# Patient Record
Sex: Female | Born: 1990
Health system: Southern US, Community
[De-identification: ages and names within clinical notes are randomized; demographics above are authoritative.]

## PROBLEM LIST (undated history)

## (undated) DIAGNOSIS — E041 Nontoxic single thyroid nodule: Secondary | ICD-10-CM

## (undated) DIAGNOSIS — J45909 Unspecified asthma, uncomplicated: Secondary | ICD-10-CM

## (undated) HISTORY — DX: Nontoxic single thyroid nodule: E04.1

---

## 2016-06-11 DIAGNOSIS — Z7189 Other specified counseling: Secondary | ICD-10-CM | POA: Diagnosis not present

## 2016-06-11 DIAGNOSIS — Z23 Encounter for immunization: Secondary | ICD-10-CM | POA: Diagnosis not present

## 2016-07-08 DIAGNOSIS — R5383 Other fatigue: Secondary | ICD-10-CM | POA: Diagnosis not present

## 2016-07-08 DIAGNOSIS — E041 Nontoxic single thyroid nodule: Secondary | ICD-10-CM | POA: Diagnosis not present

## 2016-07-08 DIAGNOSIS — N912 Amenorrhea, unspecified: Secondary | ICD-10-CM | POA: Diagnosis not present

## 2016-07-10 ENCOUNTER — Other Ambulatory Visit: Payer: Self-pay | Admitting: Family Medicine

## 2016-07-10 DIAGNOSIS — E041 Nontoxic single thyroid nodule: Secondary | ICD-10-CM

## 2016-07-15 ENCOUNTER — Other Ambulatory Visit: Payer: Self-pay

## 2016-07-17 DIAGNOSIS — R21 Rash and other nonspecific skin eruption: Secondary | ICD-10-CM | POA: Diagnosis not present

## 2016-07-19 ENCOUNTER — Ambulatory Visit
Admission: RE | Admit: 2016-07-19 | Discharge: 2016-07-19 | Disposition: A | Discharge status: Home / Self Care | Payer: 59 | Source: Ambulatory Visit | Attending: Family Medicine | Admitting: Family Medicine

## 2016-07-19 DIAGNOSIS — E041 Nontoxic single thyroid nodule: Secondary | ICD-10-CM

## 2016-07-19 DIAGNOSIS — E042 Nontoxic multinodular goiter: Secondary | ICD-10-CM | POA: Diagnosis not present

## 2016-07-20 DIAGNOSIS — J020 Streptococcal pharyngitis: Secondary | ICD-10-CM | POA: Diagnosis not present

## 2016-07-24 ENCOUNTER — Other Ambulatory Visit (HOSPITAL_COMMUNITY)
Admission: RE | Admit: 2016-07-24 | Discharge: 2016-07-24 | Disposition: A | Discharge status: Home / Self Care | Payer: 59 | Source: Ambulatory Visit | Attending: Endocrinology | Admitting: Endocrinology

## 2016-07-24 ENCOUNTER — Encounter: Payer: Self-pay | Admitting: Endocrinology

## 2016-07-24 ENCOUNTER — Ambulatory Visit (INDEPENDENT_AMBULATORY_CARE_PROVIDER_SITE_OTHER): Payer: 59 | Admitting: Endocrinology

## 2016-07-24 DIAGNOSIS — E041 Nontoxic single thyroid nodule: Secondary | ICD-10-CM | POA: Diagnosis not present

## 2016-07-24 NOTE — Progress Notes (Signed)
   Subjective:    Patient ID: Diamond Bell, female    DOB: 1990/08/18, 26 y.o.   MRN: [id number]  HPI Pt is referred by Dr Drema Dallas, for nodular thyroid.  Last week, pt was noted to have a nodule at the right ant neck.  No assoc pain.  she is unaware of ever having had thyroid problems in the past.  she has no h/o XRT or surgery to the neck.  She has slight fatigue. Past Medical History:  Diagnosis Date  . Right thyroid nodule     No past surgical history on file.  Social History   Social History  . Marital status: Single    Spouse name: N/A  . Number of children: N/A  . Years of education: N/A   Occupational History  . Not on file.   Social History Main Topics  . Smoking status: Never Smoker  . Smokeless tobacco: Never Used  . Alcohol use Yes  . Drug use: Unknown  . Sexual activity: Not on file   Other Topics Concern  . Not on file   Social History Narrative  . No narrative on file    No current outpatient prescriptions on file prior to visit.   No current facility-administered medications on file prior to visit.     No Known Allergies  Family History  Problem Relation Age of Onset  . Thyroid disease Mother     BP 120/80   Pulse 96   Ht 5' 7.5" (1.715 m)   Wt 132 lb (59.9 kg)   LMP 04/05/2016   SpO2 97%   BMI 20.37 kg/m   Review of Systems Denies weight change, hoarseness, diplopia, chest pain, sob, dysphagia, diarrhea, itching, flushing, easy bruising, headache, numbness, and rhinorrhea.  She has no menses x 3 mos.  She has slight cough.  She has mild depression and cold intolerance.    Objective:   Physical Exam VS: see vs page GEN: no distress HEAD: head: no deformity eyes: no periorbital swelling, no proptosis external nose and ears are normal mouth: no lesion seen NECK: th right thyroid nodule is easily palpable.  CHEST WALL: no deformity LUNGS: clear to auscultation CV: reg rate and rhythm, no murmur ABD: abdomen is soft, nontender.  no  hepatosplenomegaly.  not distended.  no hernia MUSCULOSKELETAL: muscle bulk and strength are grossly normal.  no obvious joint swelling.  gait is normal and steady EXTEMITIES: no deformity.  no edema PULSES: no carotid bruit NEURO:  cn 2-12 grossly intact.   readily moves all 4's.  sensation is intact to touch on all 4's SKIN:  Normal texture and temperature.  No rash or suspicious lesion is visible.   NODES:  Few palpable at the neck (right jaw angle) PSYCH: alert, well-oriented.  Does not appear anxious nor depressed.   [place]: 2.9 cm solid inferior right thyroid nodule.  outside test results are reviewed: TSH=1.55  thyroid needle bx: consent obtained, signed form on chart The area is first sprayed with cooling agent local: xylocaine 2%, with epinephrine prep: alcohol pad 4 bxs are done with 30 and 27g needles. no complications    Assessment & Plan:  Thyroid nodule: uncertain etiology.   Lymphadenopathy, new: pt advised to call us if it persists x 1 month.   Patient is advised the following: Patient Instructions  We'll let you know about the biopsy results. If as expected, no cancer is found, please come back for a follow-up appointment in 6 months.

## 2016-07-24 NOTE — Patient Instructions (Signed)
We'll let you know about the biopsy results.  If as expected, no cancer is found, please come back for a follow-up appointment in 6 months  

## 2016-07-25 ENCOUNTER — Telehealth: Payer: Self-pay | Admitting: Endocrinology

## 2016-07-25 DIAGNOSIS — J029 Acute pharyngitis, unspecified: Secondary | ICD-10-CM | POA: Diagnosis not present

## 2016-07-25 NOTE — Telephone Encounter (Signed)
please call patient: Please also advise pt: if the lymph node at your right neck persists x 1 month, please call us.  It is very unlikely thyroid-related, but it should be checked if it persists.

## 2016-07-26 DIAGNOSIS — N912 Amenorrhea, unspecified: Secondary | ICD-10-CM | POA: Diagnosis not present

## 2016-07-26 DIAGNOSIS — J020 Streptococcal pharyngitis: Secondary | ICD-10-CM | POA: Diagnosis not present

## 2016-07-26 NOTE — Telephone Encounter (Signed)
Patient notified of message and voiced understanding and had no further questions at this time.

## 2016-07-31 DIAGNOSIS — J029 Acute pharyngitis, unspecified: Secondary | ICD-10-CM | POA: Diagnosis not present

## 2017-01-24 ENCOUNTER — Ambulatory Visit: Payer: 59 | Admitting: Endocrinology

## 2017-06-27 ENCOUNTER — Ambulatory Visit (INDEPENDENT_AMBULATORY_CARE_PROVIDER_SITE_OTHER): Payer: 59 | Admitting: Endocrinology

## 2017-06-27 ENCOUNTER — Encounter: Payer: Self-pay | Admitting: Endocrinology

## 2017-06-27 VITALS — BP 104/76 | HR 84 | Wt 130.2 lb

## 2017-06-27 DIAGNOSIS — E041 Nontoxic single thyroid nodule: Secondary | ICD-10-CM | POA: Diagnosis not present

## 2017-06-27 NOTE — Progress Notes (Signed)
   Subjective:    Patient ID: Diamond Bell, female    DOB: 01-19-1991, 27 y.o.   MRN: [id number]  HPI Pt returns for f/u of thyroid nodule (dx'ed 2018; [place] then showed 2.9 cm solid inferior right thyroid nodule; bx showed BENIGN FOLLICULAR NODULE (BETHESDA CATEGORY II)).   She does notice the nodule.   Past Medical History:  Diagnosis Date  . Right thyroid nodule     History reviewed. No pertinent surgical history.  Social History   Socioeconomic History  . Marital status: Single    Spouse name: Not on file  . Number of children: Not on file  . Years of education: Not on file  . Highest education level: Not on file  Social Needs  . Financial resource strain: Not on file  . Food insecurity - worry: Not on file  . Food insecurity - inability: Not on file  . Transportation needs - medical: Not on file  . Transportation needs - non-medical: Not on file  Occupational History  . Not on file  Tobacco Use  . Smoking status: Never Smoker  . Smokeless tobacco: Never Used  Substance and Sexual Activity  . Alcohol use: Yes  . Drug use: Not on file  . Sexual activity: Not on file  Other Topics Concern  . Not on file  Social History Narrative  . Not on file    No current outpatient medications on file prior to visit.   No current facility-administered medications on file prior to visit.     No Known Allergies  Family History  Problem Relation Age of Onset  . Thyroid disease Mother     BP 104/76 (BP Location: Left Arm, Patient Position: Sitting, Cuff Size: Normal)   Pulse 84   Wt 130 lb 3.2 oz (59.1 kg)   SpO2 98%   BMI 20.09 kg/m   Review of Systems Denies neck pain and hoarseness.      Objective:   Physical Exam VS: see vs page GEN: no distress NECK: the right thyroid nodule is again easily palpable.       Assessment & Plan:  Thyroid nodule: we discussed blood test and [place].  She declines for now  Patient Instructions  I'm glad you are feeling  well. Please come back for a follow-up appointment in 1 year.

## 2017-06-27 NOTE — Patient Instructions (Addendum)
I'm glad you are feeling well. Please come back for a follow-up appointment in 1 year.

## 2017-07-09 DIAGNOSIS — E049 Nontoxic goiter, unspecified: Secondary | ICD-10-CM | POA: Diagnosis not present

## 2017-07-09 DIAGNOSIS — M542 Cervicalgia: Secondary | ICD-10-CM | POA: Diagnosis not present

## 2017-07-09 DIAGNOSIS — S0990XA Unspecified injury of head, initial encounter: Secondary | ICD-10-CM | POA: Diagnosis not present

## 2018-01-01 DIAGNOSIS — R110 Nausea: Secondary | ICD-10-CM | POA: Diagnosis not present

## 2018-06-29 ENCOUNTER — Ambulatory Visit: Payer: 59 | Admitting: Endocrinology

## 2019-02-23 DIAGNOSIS — D367 Benign neoplasm of other specified sites: Secondary | ICD-10-CM | POA: Insufficient documentation

## 2019-02-23 DIAGNOSIS — R1031 Right lower quadrant pain: Secondary | ICD-10-CM | POA: Diagnosis present

## 2019-02-23 DIAGNOSIS — Z79899 Other long term (current) drug therapy: Secondary | ICD-10-CM | POA: Diagnosis not present

## 2019-02-23 LAB — URINALYSIS, ROUTINE W REFLEX MICROSCOPIC
Bilirubin Urine: NEGATIVE
Glucose, UA: NEGATIVE mg/dL
Hgb urine dipstick: NEGATIVE
Ketones, ur: 20 mg/dL — AB
Nitrite: NEGATIVE
Protein, ur: NEGATIVE mg/dL
Specific Gravity, Urine: 1.015 (ref 1.005–1.030)
pH: 6 (ref 5.0–8.0)

## 2019-02-23 LAB — COMPREHENSIVE METABOLIC PANEL
ALT: 21 U/L (ref 0–44)
AST: 21 U/L (ref 15–41)
Albumin: 4.7 g/dL (ref 3.5–5.0)
Alkaline Phosphatase: 48 U/L (ref 38–126)
Anion gap: 13 (ref 5–15)
BUN: 9 mg/dL (ref 6–20)
CO2: 21 mmol/L — ABNORMAL LOW (ref 22–32)
Calcium: 8.7 mg/dL — ABNORMAL LOW (ref 8.9–10.3)
Chloride: 103 mmol/L (ref 98–111)
Creatinine, Ser: 0.7 mg/dL (ref 0.44–1.00)
GFR calc Af Amer: >60 mL/min (ref >60)
GFR calc non Af Amer: >60 mL/min (ref >60)
Glucose, Bld: 144 mg/dL — ABNORMAL HIGH (ref 70–99)
Potassium: 3 mmol/L — ABNORMAL LOW (ref 3.5–5.1)
Sodium: 137 mmol/L (ref 135–145)
Total Bilirubin: 1 mg/dL (ref 0.3–1.2)
Total Protein: 7 g/dL (ref 6.5–8.1)

## 2019-02-23 LAB — CBC
HCT: 46.1 % — ABNORMAL HIGH (ref 36.0–46.0)
Hemoglobin: 15.4 g/dL — ABNORMAL HIGH (ref 12.0–15.0)
MCH: 31 pg (ref 26.0–34.0)
MCHC: 33.4 g/dL (ref 30.0–36.0)
MCV: 92.8 fL (ref 80.0–100.0)
Platelets: 263 10*3/uL (ref 150–400)
RBC: 4.97 MIL/uL (ref 3.87–5.11)
RDW: 11.4 % — ABNORMAL LOW (ref 11.5–15.5)
WBC: 10.5 10*3/uL (ref 4.0–10.5)
nRBC: 0 % (ref 0.0–0.2)

## 2019-02-23 LAB — LIPASE, BLOOD: Lipase: 31 U/L (ref 11–51)

## 2019-02-23 LAB — I-STAT BETA HCG BLOOD, ED (MC, WL, AP ONLY): I-stat hCG, quantitative: <5 m[IU]/mL (ref <5)

## 2019-02-23 MED ORDER — SODIUM CHLORIDE 0.9% FLUSH
3.0000 mL | Freq: Once | INTRAVENOUS | Status: AC
  Administered 2019-02-24: 02:00:00 3 mL via INTRAVENOUS

## 2019-02-23 NOTE — ED Triage Notes (Signed)
LRQ pain with diarrhea and nausea, tender to touch

## 2019-02-24 ENCOUNTER — Emergency Department (HOSPITAL_COMMUNITY): Payer: 59

## 2019-02-24 ENCOUNTER — Encounter (HOSPITAL_COMMUNITY): Payer: Self-pay

## 2019-02-24 ENCOUNTER — Telehealth: Payer: Self-pay | Admitting: *Deleted

## 2019-02-24 ENCOUNTER — Emergency Department (HOSPITAL_COMMUNITY)
Admission: EM | Admit: 2019-02-24 | Discharge: 2019-02-24 | Disposition: A | Discharge status: Home / Self Care | Payer: 59 | Attending: Emergency Medicine | Admitting: Emergency Medicine

## 2019-02-24 DIAGNOSIS — R1900 Intra-abdominal and pelvic swelling, mass and lump, unspecified site: Secondary | ICD-10-CM

## 2019-02-24 DIAGNOSIS — D369 Benign neoplasm, unspecified site: Secondary | ICD-10-CM

## 2019-02-24 MED ORDER — ONDANSETRON HCL 4 MG/2ML IJ SOLN
4.0000 mg | Freq: Once | INTRAMUSCULAR | Status: AC
  Administered 2019-02-24: 4 mg via INTRAVENOUS
  Filled 2019-02-24: qty 2

## 2019-02-24 MED ORDER — OXYCODONE-ACETAMINOPHEN 5-325 MG PO TABS: 1.0000 | ORAL_TABLET | Freq: Four times a day (QID) | ORAL | 0 refills | Status: DC | PRN

## 2019-02-24 MED ORDER — OXYCODONE-ACETAMINOPHEN 5-325 MG PO TABS
1.0000 | ORAL_TABLET | Freq: Once | ORAL | Status: AC
  Administered 2019-02-24: 08:00:00 1 via ORAL
  Filled 2019-02-24: qty 1

## 2019-02-24 MED ORDER — MORPHINE SULFATE (PF) 4 MG/ML IV SOLN
4.0000 mg | Freq: Once | INTRAVENOUS | Status: AC
  Administered 2019-02-24: 02:00:00 4 mg via INTRAVENOUS
  Filled 2019-02-24: qty 1

## 2019-02-24 MED ORDER — SODIUM CHLORIDE 0.9 % IV BOLUS
1000.0000 mL | Freq: Once | INTRAVENOUS | Status: AC
  Administered 2019-02-24: 02:00:00 1000 mL via INTRAVENOUS

## 2019-02-24 MED ORDER — SODIUM CHLORIDE (PF) 0.9 % IJ SOLN
INTRAMUSCULAR | Status: AC
  Administered 2019-02-24: 07:00:00
  Filled 2019-02-24: qty 50

## 2019-02-24 MED ORDER — IOHEXOL 300 MG/ML  SOLN
100.0000 mL | Freq: Once | INTRAMUSCULAR | Status: AC | PRN
  Administered 2019-02-24: 03:00:00 100 mL via INTRAVENOUS

## 2019-02-24 NOTE — Discharge Instructions (Signed)
You were seen today for pelvic pain.  YOu have a dermoid cyst.  Follow-up with [facility].

## 2019-02-24 NOTE — ED Notes (Signed)
Patient transported to CT 

## 2019-02-24 NOTE — Telephone Encounter (Signed)
I received a call from [place]. She explained she was seen in ER and they told her to call our number to get surgery scheduled. I informed her she would need to be seen in the office first by a provider and then surgery scheduled. I did inform her I can see the ER doctor spoke with Dr.Stinson but did not indicate if it was our office or one of our sister offices. I informed her I will message the doctor and we will get back to her. She states it was supposed to be urgent due to the pain she is having.  I checked with our front desk and she was scheduled for 03/09/19 . I called [place] to inform her and left a message stating she has an appointment 03/09/19 and I will check with provider to see if he thinks it should be sooner.  Chris Cripps,RN

## 2019-02-24 NOTE — ED Provider Notes (Signed)
[facility] DEPT Provider Note   CSN: [id number] Arrival date & time: 02/23/19  1853     History   Chief Complaint Chief Complaint  Patient presents with   Abdominal Pain    HPI Diamond Bell is a 28 y.o. female.     HPI  This is a 28 year old female who presents with right lower quadrant pain.  Patient reports onset of symptoms around 4 PM yesterday.  She reports gradually worsening right lower quadrant pain that is nonradiating.  She has had nausea without vomiting.  No fevers.  No urinary symptoms.  She denies any back pain or history of kidney stones.  She does state that she had some loose stools yesterday.  No fevers.  She currently rates her pain at 7 out of 10.  She has not taken anything for pain.  Denies vaginal discharge or concerns for STDs.  Past Medical History:  Diagnosis Date   Right thyroid nodule     Patient Active Problem List   Diagnosis Date Noted   Right thyroid nodule     No past surgical history on file.   OB History   No obstetric history on file.      Home Medications    Prior to Admission medications   Medication Sig Start Date End Date Taking? Authorizing Provider  Ascorbic Acid (VITAMIN C) 1000 MG tablet Take 1,000 mg by mouth daily.   Yes [provider]  cholecalciferol (VITAMIN D3) 25 MCG (1000 UT) tablet Take 1,000 Units by mouth daily.   Yes [provider]  EVENING PRIMROSE OIL PO Take 1 tablet by mouth daily.   Yes [provider]  ibuprofen (ADVIL) 200 MG tablet Take 400 mg by mouth every 6 (six) hours as needed for moderate pain.   Yes [provider]  oxyCODONE-acetaminophen (PERCOCET/ROXICET) 5-325 MG tablet Take 1 tablet by mouth every 6 (six) hours as needed for severe pain. 02/24/19   Devun Anna, Barbette Hair, MD    Family History Family History  Problem Relation Age of Onset   Thyroid disease Mother     Social History Social History   Tobacco Use     Smoking status: Never Smoker   Smokeless tobacco: Never Used  Substance Use Topics   Alcohol use: Yes   Drug use: Not on file     Allergies   Patient has no known allergies.   Review of Systems Review of Systems  Constitutional: Negative for fever.  Respiratory: Negative for shortness of breath.   Cardiovascular: Negative for chest pain.  Gastrointestinal: Positive for abdominal pain and nausea. Negative for vomiting.  Genitourinary: Negative for dysuria.  Musculoskeletal: Negative for back pain.  All other systems reviewed and are negative.    Physical Exam Updated Vital Signs BP 114/70    Pulse 89    Temp 98.9 F (37.2 C) (Oral)    Resp 16    Ht 1.702 m (5\' 7" )    Wt 59 kg    SpO2 95%    BMI 20.36 kg/m   Physical Exam Vitals signs and nursing note reviewed.  Constitutional:      Appearance: She is well-developed.  HENT:     Head: Normocephalic and atraumatic.  Eyes:     Pupils: Pupils are equal, round, and reactive to light.  Neck:     Musculoskeletal: Neck supple.  Cardiovascular:     Rate and Rhythm: Normal rate and regular rhythm.     Heart sounds: Normal  heart sounds.  Pulmonary:     Effort: Pulmonary effort is normal. No respiratory distress.     Breath sounds: No wheezing.  Abdominal:     General: Bowel sounds are normal.     Palpations: Abdomen is soft.     Tenderness: There is abdominal tenderness in the right lower quadrant. There is no guarding or rebound. Positive signs include Rovsing's sign.  Skin:    General: Skin is warm and dry.  Neurological:     Mental Status: She is alert and oriented to person, place, and time.  Psychiatric:        Mood and Affect: Mood normal.      ED Treatments / Results  Labs (all labs ordered are listed, but only abnormal results are displayed) Labs Reviewed  COMPREHENSIVE METABOLIC PANEL - Abnormal; Notable for the following components:      Result Value   Potassium 3.0 (*)    CO2 21 (*)    Glucose,  Bld 144 (*)    Calcium 8.7 (*)    All other components within normal limits  CBC - Abnormal; Notable for the following components:   Hemoglobin 15.4 (*)    HCT 46.1 (*)    RDW 11.4 (*)    All other components within normal limits  URINALYSIS, ROUTINE W REFLEX MICROSCOPIC - Abnormal; Notable for the following components:   APPearance HAZY (*)    Ketones, ur 20 (*)    Leukocytes,Ua SMALL (*)    Bacteria, UA RARE (*)    All other components within normal limits  LIPASE, BLOOD  I-STAT BETA HCG BLOOD, ED ([facility], [facility], [facility] ONLY)    EKG None  Radiology US Pelvis Transvaginal Non-ob (tv Only)  Addendum Date: 02/24/2019   ADDENDUM REPORT: 02/24/2019 05:08 ADDENDUM: This addendum is created after discussion with the technologist to clarify pelvic mass finding. The heterogeneous 9.4 x 7.2 x 8.1 cm heterogeneous mass consistent with dermoid arises from the right ovary. Electronically Signed   By: Keith Rake M.D.   On: 02/24/2019 05:08   Result Date: 02/24/2019 CLINICAL DATA:  Initial evaluation for pelvic mass. EXAM: TRANSABDOMINAL AND TRANSVAGINAL ULTRASOUND OF PELVIS DOPPLER ULTRASOUND OF OVARIES TECHNIQUE: Both transabdominal and transvaginal ultrasound examinations of the pelvis were performed. Transabdominal technique was performed for global imaging of the pelvis including uterus, ovaries, adnexal regions, and pelvic cul-de-sac. It was necessary to proceed with endovaginal exam following the transabdominal exam to visualize the uterus, endometrium, and ovaries. Color and duplex Doppler ultrasound was utilized to evaluate blood flow to the ovaries. COMPARISON:  Prior CT from earlier the same day. FINDINGS: Uterus Measurements: 8.7 x 2.8 x 4.5 cm = volume: 56.6 mL. No fibroids or other mass visualized. Endometrium Thickness: 9.3 mm.  No focal abnormality visualized. Right ovary Measurements: 8.8 x 4.5 x 7.9 cm = volume: 164.6 mL. Normal appearance/no adnexal mass. Left ovary Measurements: 5.4 x  2.7 x 3.6 cm = volume: 27.5 mL. Normal appearance/no adnexal mass. Pulsed Doppler evaluation of both ovaries demonstrates normal low-resistance arterial and venous waveforms. Other findings Previously identified large pelvic mass again seen along the midline of the pelvis, measuring 9.4 x 7.2 x 8.1 cm. Central hyperechoic echotexture consistent with fat. Again, this lesion is most consistent with a dermoid. Difficult to delineate which ovary this arises from given size and midline location. Associated small volume free fluid noted within the pelvis. IMPRESSION: 1. 9.4 x 7.2 x 8.1 cm heterogeneous mass positioned at the mid pelvis, consistent with  previously identified dermoid. Given size and midline position, it is difficult to delineate which ovary this arises from. Gynecologic consultation recommended. Additionally, further evaluation with dedicated pelvic MRI may be helpful for further characterization as warranted. 2. No evidence for ovarian torsion or other acute abnormality. Electronically Signed: By: Jeannine Boga M.D. On: 02/24/2019 04:45   US Pelvis Complete  Addendum Date: 02/24/2019   ADDENDUM REPORT: 02/24/2019 05:08 ADDENDUM: This addendum is created after discussion with the technologist to clarify pelvic mass finding. The heterogeneous 9.4 x 7.2 x 8.1 cm heterogeneous mass consistent with dermoid arises from the right ovary. Electronically Signed   By: Keith Rake M.D.   On: 02/24/2019 05:08   Result Date: 02/24/2019 CLINICAL DATA:  Initial evaluation for pelvic mass. EXAM: TRANSABDOMINAL AND TRANSVAGINAL ULTRASOUND OF PELVIS DOPPLER ULTRASOUND OF OVARIES TECHNIQUE: Both transabdominal and transvaginal ultrasound examinations of the pelvis were performed. Transabdominal technique was performed for global imaging of the pelvis including uterus, ovaries, adnexal regions, and pelvic cul-de-sac. It was necessary to proceed with endovaginal exam following the transabdominal exam to  visualize the uterus, endometrium, and ovaries. Color and duplex Doppler ultrasound was utilized to evaluate blood flow to the ovaries. COMPARISON:  Prior CT from earlier the same day. FINDINGS: Uterus Measurements: 8.7 x 2.8 x 4.5 cm = volume: 56.6 mL. No fibroids or other mass visualized. Endometrium Thickness: 9.3 mm.  No focal abnormality visualized. Right ovary Measurements: 8.8 x 4.5 x 7.9 cm = volume: 164.6 mL. Normal appearance/no adnexal mass. Left ovary Measurements: 5.4 x 2.7 x 3.6 cm = volume: 27.5 mL. Normal appearance/no adnexal mass. Pulsed Doppler evaluation of both ovaries demonstrates normal low-resistance arterial and venous waveforms. Other findings Previously identified large pelvic mass again seen along the midline of the pelvis, measuring 9.4 x 7.2 x 8.1 cm. Central hyperechoic echotexture consistent with fat. Again, this lesion is most consistent with a dermoid. Difficult to delineate which ovary this arises from given size and midline location. Associated small volume free fluid noted within the pelvis. IMPRESSION: 1. 9.4 x 7.2 x 8.1 cm heterogeneous mass positioned at the mid pelvis, consistent with previously identified dermoid. Given size and midline position, it is difficult to delineate which ovary this arises from. Gynecologic consultation recommended. Additionally, further evaluation with dedicated pelvic MRI may be helpful for further characterization as warranted. 2. No evidence for ovarian torsion or other acute abnormality. Electronically Signed: By: Jeannine Boga M.D. On: 02/24/2019 04:45   Ct Abdomen Pelvis W Contrast  Result Date: 02/24/2019 CLINICAL DATA:  Right lower quadrant abdominal pain. EXAM: CT ABDOMEN AND PELVIS WITH CONTRAST TECHNIQUE: Multidetector CT imaging of the abdomen and pelvis was performed using the standard protocol following bolus administration of intravenous contrast. CONTRAST:  180mL OMNIPAQUE IOHEXOL 300 MG/ML  SOLN COMPARISON:  None.  FINDINGS: Lower chest: Lung bases are clear. Hepatobiliary: No focal liver abnormality is seen. No gallstones, gallbladder wall thickening, or biliary dilatation. Pancreas: No ductal dilatation or inflammation. Spleen: Normal in size without focal abnormality. Splenule anteriorly. Adrenals/Urinary Tract: Normal adrenal glands. No hydronephrosis or perinephric edema. Homogeneous renal enhancement. Urinary bladder is physiologically distended without wall thickening. Stomach/Bowel: Normal appendix, for example series 2, image 57. Moderate stool in the right colon, remainder of the colon is decompressed. No bowel obstruction or inflammation. The stomach is unremarkable. Vascular/Lymphatic: Abdominal aorta is normal in caliber. The portal vein is patent. Slight prominence of left ovarian veins. No adenopathy. Reproductive: Large heterogeneous pelvic mass which involves the midline, as  well as both adnexa, slightly more prominent on the left. This measures 11.5 x 9.4 x 8.7 cm. Is heterogeneous enhancement in a fat containing component, findings consistent with dermoid. Is uncertain which ovary this arises from, however does appear to extend more into the left adnexa. Uterus is displaced anteriorly. Probable nabothian cyst. Slight prominent left adnexal vascularity. Other: No free fluid. No free air. Musculoskeletal: There are no acute or suspicious osseous abnormalities. IMPRESSION: 1. Large heterogeneous pelvic mass measuring 11.5 x 9.4 x 8.7 cm with fatty components consistent with dermoid. It is uncertain which ovary this arises from, however does appear to extend more into the left adnexa. While pelvic ultrasound may provide more detailed assessment, given size, gynecologic consultation is recommended. 2. Normal appendix. Electronically Signed   By: Keith Rake M.D.   On: 02/24/2019 02:58   US Pelvic Doppler (torsion R/o Or Mass Arterial Flow)  Addendum Date: 02/24/2019   ADDENDUM REPORT: 02/24/2019 05:08  ADDENDUM: This addendum is created after discussion with the technologist to clarify pelvic mass finding. The heterogeneous 9.4 x 7.2 x 8.1 cm heterogeneous mass consistent with dermoid arises from the right ovary. Electronically Signed   By: Keith Rake M.D.   On: 02/24/2019 05:08   Result Date: 02/24/2019 CLINICAL DATA:  Initial evaluation for pelvic mass. EXAM: TRANSABDOMINAL AND TRANSVAGINAL ULTRASOUND OF PELVIS DOPPLER ULTRASOUND OF OVARIES TECHNIQUE: Both transabdominal and transvaginal ultrasound examinations of the pelvis were performed. Transabdominal technique was performed for global imaging of the pelvis including uterus, ovaries, adnexal regions, and pelvic cul-de-sac. It was necessary to proceed with endovaginal exam following the transabdominal exam to visualize the uterus, endometrium, and ovaries. Color and duplex Doppler ultrasound was utilized to evaluate blood flow to the ovaries. COMPARISON:  Prior CT from earlier the same day. FINDINGS: Uterus Measurements: 8.7 x 2.8 x 4.5 cm = volume: 56.6 mL. No fibroids or other mass visualized. Endometrium Thickness: 9.3 mm.  No focal abnormality visualized. Right ovary Measurements: 8.8 x 4.5 x 7.9 cm = volume: 164.6 mL. Normal appearance/no adnexal mass. Left ovary Measurements: 5.4 x 2.7 x 3.6 cm = volume: 27.5 mL. Normal appearance/no adnexal mass. Pulsed Doppler evaluation of both ovaries demonstrates normal low-resistance arterial and venous waveforms. Other findings Previously identified large pelvic mass again seen along the midline of the pelvis, measuring 9.4 x 7.2 x 8.1 cm. Central hyperechoic echotexture consistent with fat. Again, this lesion is most consistent with a dermoid. Difficult to delineate which ovary this arises from given size and midline location. Associated small volume free fluid noted within the pelvis. IMPRESSION: 1. 9.4 x 7.2 x 8.1 cm heterogeneous mass positioned at the mid pelvis, consistent with previously  identified dermoid. Given size and midline position, it is difficult to delineate which ovary this arises from. Gynecologic consultation recommended. Additionally, further evaluation with dedicated pelvic MRI may be helpful for further characterization as warranted. 2. No evidence for ovarian torsion or other acute abnormality. Electronically Signed: By: Jeannine Boga M.D. On: 02/24/2019 04:45    Procedures Procedures (including critical care time)  Medications Ordered in ED Medications  sodium chloride flush (NS) 0.9 % injection 3 mL (3 mLs Intravenous Given 02/24/19 0222)  sodium chloride 0.9 % bolus 1,000 mL (0 mLs Intravenous Stopped 02/24/19 0414)  morphine 4 MG/ML injection 4 mg (4 mg Intravenous Given 02/24/19 0221)  ondansetron (ZOFRAN) injection 4 mg (4 mg Intravenous Given 02/24/19 0221)  sodium chloride (PF) 0.9 % injection (  Given by Other 02/24/19 0644)  iohexol (OMNIPAQUE) 300 MG/ML solution 100 mL (100 mLs Intravenous Contrast Given 02/24/19 0236)  oxyCODONE-acetaminophen (PERCOCET/ROXICET) 5-325 MG per tablet 1 tablet (1 tablet Oral Given 02/24/19 0740)     Initial Impression / Assessment and Plan / ED Course  I have reviewed the triage vital signs and the nursing notes.  Pertinent labs & imaging results that were available during my care of the patient were reviewed by me and considered in my medical decision making (see chart for details).        This is a 28 year old female who presents with abdominal pain.  She is overall nontoxic-appearing and vital signs are reassuring.  She does have tenderness on exam in the right lower quadrant.  Considerations include appendicitis, ovarian pathology.  She denies any history or concern for STDs.  Patient was given pain and nausea medicine.  Lab work reviewed and largely reassuring; however, given tenderness, will obtain CT.  CT is concerning for likely a dermoid cyst but question the origin.  Pelvic ultrasound was obtained.   Pelvic ultrasound confirms a large likely dermoid cyst but no evidence of torsion.  Patient was advised of the diagnosis.  I discussed the patient with Dr. Nehemiah Settle who has reviewed the chart and will arrange for follow-up in surgery.  Patient was given follow-up information.  She was given a short course of pain medication.  After history, exam, and medical workup I feel the patient has been appropriately medically screened and is safe for discharge home. Pertinent diagnoses were discussed with the patient. Patient was given return precautions.   Final Clinical Impressions(s) / ED Diagnoses   Final diagnoses:  Pelvic mass  Dermoid cyst    ED Discharge Orders         Ordered    oxyCODONE-acetaminophen (PERCOCET/ROXICET) 5-325 MG tablet  Every 6 hours PRN,   Status:  Discontinued     02/24/19 0911    oxyCODONE-acetaminophen (PERCOCET/ROXICET) 5-325 MG tablet  Every 6 hours PRN     02/24/19 0913           Merryl Hacker, MD 02/24/19 [phone]

## 2019-02-25 NOTE — Telephone Encounter (Signed)
I called Diamond Bell with Dr.Stinson's feedback and asked how she is doing. She states she hasn't used any of the percocet yet because she is worried about running out. I explained we want her to use ibuprofen as needed for mild to moderate pain and then only use percocet for moderate to severe pain as needed. We don't want her to be suffering.  If she feels by Monday she is having to use the percocet and will run out before 11/3 or her pain is not controlled well- to call us so we can move her appointment up. I also instructed her if she is having severe pain unrelieved by her meds to go to the ED as that could indicate a worsening of her condition. She voices understanding.

## 2019-02-25 NOTE — Telephone Encounter (Signed)
She was given pain medicine by the ED. As long as her pain stays moderately controlled, then it will be fine to leave the appointment as is. If her pain isn't controlled, then it can be moved up.

## 2019-03-08 ENCOUNTER — Telehealth: Payer: Self-pay | Admitting: Obstetrics and Gynecology

## 2019-03-08 NOTE — Telephone Encounter (Signed)
Called the patient to confirm the upcoming visit. The patient answered no to the covid screening questions. Advised the patient of no visitors or children are permitted due to covid restrictions. The patient verbalized understanding.

## 2019-03-09 ENCOUNTER — Ambulatory Visit (INDEPENDENT_AMBULATORY_CARE_PROVIDER_SITE_OTHER): Payer: 59 | Admitting: Obstetrics and Gynecology

## 2019-03-09 ENCOUNTER — Encounter: Payer: Self-pay | Admitting: Obstetrics and Gynecology

## 2019-03-09 ENCOUNTER — Other Ambulatory Visit: Payer: Self-pay

## 2019-03-09 VITALS — BP 133/83 | HR 108 | Wt 131.4 lb

## 2019-03-09 DIAGNOSIS — D369 Benign neoplasm, unspecified site: Secondary | ICD-10-CM | POA: Diagnosis not present

## 2019-03-09 DIAGNOSIS — Z124 Encounter for screening for malignant neoplasm of cervix: Secondary | ICD-10-CM

## 2019-03-09 DIAGNOSIS — Z01419 Encounter for gynecological examination (general) (routine) without abnormal findings: Secondary | ICD-10-CM

## 2019-03-09 HISTORY — DX: Benign neoplasm, unspecified site: D36.9

## 2019-03-09 MED ORDER — IBUPROFEN 800 MG PO TABS: 800.0000 mg | ORAL_TABLET | Freq: Three times a day (TID) | ORAL | 2 refills | Status: DC | PRN

## 2019-03-09 MED ORDER — OXYCODONE-ACETAMINOPHEN 5-325 MG PO TABS: 1.0000 | ORAL_TABLET | Freq: Four times a day (QID) | ORAL | 0 refills | Status: DC | PRN

## 2019-03-09 NOTE — Patient Instructions (Signed)
Exploratory Laparotomy, Adult, Care After °This sheet gives you information about how to care for yourself after your procedure. Your health care provider may also give you more specific instructions. If you have problems or questions, contact your health care provider. °What can I expect after the procedure? °After the procedure, it is common to have: °· Abdominal soreness. °· Fatigue. °· A sore throat from the tube in your throat. °· A lack of appetite. °Follow these instructions at home: °Medicines °· Take over-the-counter and prescription medicines only as told by your health care provider. °· If you were prescribed an antibiotic medicine, take it as told by your health care provider. Do not stop taking the antibiotic even if you start to feel better. °· Do not drive or operate heavy machinery while taking pain medicine. °· If you are taking prescription pain medicine, take actions to prevent or treat constipation. Your health care provider may recommend that you: °? Drink enough fluid to keep your urine pale yellow. °? Eat foods that are high in fiber, such as fresh fruits and vegetables, whole grains, and beans. °? Limit foods that are high in fat and processed sugars, such as fried or sweet foods. °? Take an over-the-counter or prescription medicine for constipation. Undergoing surgery and taking pain medicines can make constipation worse. °Incision care ° °· Follow instructions from your health care provider about how to take care of your incision. Make sure you: °? Wash your hands with soap and water before you change your bandage (dressing). If soap and water are not available, use hand sanitizer. °? Change your dressing as told by your health care provider. °? Leave stitches (sutures), skin glue, or adhesive strips in place. These skin closures may need to stay in place for 2 weeks or longer. If adhesive strip edges start to loosen and curl up, you may trim the loose edges. Do not remove adhesive strips  completely unless your health care provider tells you to do that. °· If you were sent home with a drain, follow instructions from your health care provider about how to care for it. °· Check your incision area every day for signs of infection. Check for: °? Redness, swelling, or pain. °? Fluid or blood. °? Warmth. °? Pus or a bad smell. °Activity ° °· Rest as told by your health care provider. °? Avoid sitting for a long time without moving. Get up to take short walks every 1-2 hours. This is important to improve blood flow and breathing. Ask for help if you feel weak or unsteady. °· Do not lift anything that is heavier than 5 lb (2.2 kg), or the limit that your health care provider tells you, until he or she says that it is safe. °· Ask your health care provider when you can start to do your usual activities again, such as driving, going back to work, and having sex. °Eating and drinking °· You may eat what you usually eat. Include lots of whole grains, fruits, and vegetables in your diet. This will help to prevent constipation. °· Drink enough fluid to keep your urine pale yellow. °Bathing °· Keep your incision clean and dry. Clean it as often as told by your health care provider: °? Gently wash the incision with soap and water. °? Rinse the incision with water to remove all soap. °? Pat the incision dry with a clean towel. Do not rub the incision. °· You may take showers after 48 hours. °· Do not take baths,   swim, or use a hot tub until your health care provider says it is okay to do so. °General instructions °· Do not use any products that contain nicotine or tobacco, such as cigarettes and e-cigarettes. These can delay healing after surgery. If you need help quitting, ask your health care provider. °· Wear compression stockings as told by your health care provider. These stockings help to prevent blood clots and reduce swelling in your legs. °· Keep all follow-up visits as told by your health care provider.  This is important. °Contact a health care provider if: °· You have a fever. °· You have chills. °· Your pain medicine is not helping. °· You have constipation or diarrhea. °· You have nausea or vomiting. °· You have drainage, redness, swelling, or pain at your incision site. °Get help right away if: °· Your pain is getting worse. °· You have not had a bowel movement for more than 3 days. °· You have ongoing (persistent) vomiting. °· The edges of your incision open up. °· You have warmth, tenderness, and swelling in your calf. °· You have trouble breathing. °· You have chest pain. °These symptoms may represent a serious problem that is an emergency. Do not wait to see if the symptoms will go away. Get medical help right away. Call your local emergency services (911 in the [place]). Do not drive yourself to the hospital. °Summary °· Abdominal soreness is common after exploratory laparotomy. Take over-the-counter and prescription pain medicines only as told by your health care provider. °· Follow instructions from your health care provider about how to take care of your incision. Do not take baths, swim, or use a hot tub until your health care provider says it is okay to do so. °· Watch for signs and symptoms of infection after surgery, including fever, chills, drainage from your incision, and worsening abdominal pain. °This information is not intended to replace advice given to you by your health care provider. Make sure you discuss any questions you have with your health care provider. °Document Released: 12/05/2003 Document Revised: 06/15/2018 Document Reviewed: 05/02/2017 °Elsevier Patient Education © 2020 [employer] ° °

## 2019-03-09 NOTE — Progress Notes (Signed)
Diamond Bell is a 28 yo nulligravid female who presents in follow up form the ER. Was seen in ER for pelvic pain and Dx with a ovarian dermoid cyst Reports pain has resolved. Presently not sexual active. LMP, current. Cycles regular and monthly No contraception No chronic medical problems or medications No surgeries No bowel or bladder dysfunction Never had a pap No H/O STI  PE AF VSS Lungs clear Heart RRR Abd soft + BS GU nl EGBUS, menses noted, pap smear obtained uterus small mobile, adnexal fullness noted more left than right, slightly tender  A/P Dermoid cyst  Dx discussed with pt. Pap smear for HM today. Surgery recommended. R/B/Post op care discussed. Ex Lap d/t to size of cyst. Will schedule and pt will be contacted with date and time of surgery. Refilled Percocet and Motrin.

## 2019-03-11 LAB — CYTOLOGY - PAP: Diagnosis: NEGATIVE

## 2019-03-30 NOTE — Progress Notes (Signed)
[facility] [id number], [place] [address] [place] [address] Phone: [phone] Fax: [phone]      Your procedure is scheduled on Wednesday, December 2nd, 2020.   Report to [facility] Main Entrance "A" at 6:30 A.M., and check in at the Admitting office.   Call this number if you have problems the morning of surgery:  [phone]  Call [phone] if you have any questions prior to your surgery date Monday-Friday 8am-4pm   Remember:  Do not eat after midnight the night before your surgery  You may drink clear liquids until 5:30AM the morning of your surgery.    Clear liquids allowed are: Water, Non-Citrus Juices (without pulp), Carbonated Beverages, Clear Tea, Black Coffee Only, and Gatorade    Take these medicines the morning of surgery with A SIP OF WATER :  Oxycodone-Acetaminophen (Percocet/Roxicet) - if needed  7 days prior to surgery STOP taking any Aspirin (unless otherwise instructed by your surgeon), Aleve, Naproxen, Ibuprofen, Motrin, Advil, Goody's, BC's, all herbal medications, fish oil, and all vitamins.    The Morning of Surgery  Do not wear jewelry, make-up or nail polish.  Do not wear lotions, powders, or perfumes/colognes, or deodorant  Do not shave 48 hours prior to surgery.  Men may shave face and neck.  Do not bring valuables to the hospital.  [facility] is not responsible for any belongings or valuables.  If you are a smoker, DO NOT Smoke 24 hours prior to surgery  If you wear a CPAP at night please bring your mask, tubing, and machine the morning of surgery   Remember that you must have someone to transport you home after your surgery, and remain with you for 24 hours if you are discharged the same day.   Please bring cases for contacts, glasses, hearing aids, dentures or bridgework because it cannot be worn into surgery.    Leave your suitcase in the  car.  After surgery it may be brought to your room.  For patients admitted to the hospital, discharge time will be determined by your treatment team.  Patients discharged the day of surgery will not be allowed to drive home.    Special instructions:   [facility]- Preparing For Surgery  Before surgery, you can play an important role. Because skin is not sterile, your skin needs to be as free of germs as possible. You can reduce the number of germs on your skin by washing with CHG (chlorahexidine gluconate) Soap before surgery.  CHG is an antiseptic cleaner which kills germs and bonds with the skin to continue killing germs even after washing.    Oral Hygiene is also important to reduce your risk of infection.  Remember - BRUSH YOUR TEETH THE MORNING OF SURGERY WITH YOUR REGULAR TOOTHPASTE  Please do not use if you have an allergy to CHG or antibacterial soaps. If your skin becomes reddened/irritated stop using the CHG.  Do not shave (including legs and underarms) for at least 48 hours prior to first CHG shower. It is OK to shave your face.  Please follow these instructions carefully.   1. Shower the NIGHT BEFORE SURGERY and the MORNING OF SURGERY with CHG Soap.   2. If you chose to wash your hair, wash your hair first as usual with your normal shampoo.  3. After you shampoo, rinse your hair and body thoroughly to remove the shampoo.  4. Use CHG as you would any other liquid soap. You can apply CHG directly to the skin and wash gently with a scrungie or a clean washcloth.   5. Apply the CHG Soap to your body ONLY FROM THE NECK DOWN.  Do not use on open wounds or open sores. Avoid contact with your eyes, ears, mouth and genitals (private parts). Wash Face and genitals (private parts)  with your normal soap.   6. Wash thoroughly, paying special attention to the area where your surgery will be performed.  7. Thoroughly rinse your body with warm water from the neck down.  8. DO NOT  shower/wash with your normal soap after using and rinsing off the CHG Soap.  9. Pat yourself dry with a CLEAN TOWEL.  10. Wear CLEAN PAJAMAS to bed the night before surgery, wear comfortable clothes the morning of surgery  11. Place CLEAN SHEETS on your bed the night of your first shower and DO NOT SLEEP WITH PETS.    Day of Surgery:  Please shower the morning of surgery with the CHG soap Do not apply any deodorants/lotions. Please wear clean clothes to the hospital/surgery center.   Remember to brush your teeth WITH YOUR REGULAR TOOTHPASTE.   Please read over the following fact sheets that you were given.

## 2019-03-31 ENCOUNTER — Encounter (HOSPITAL_COMMUNITY): Payer: Self-pay

## 2019-03-31 ENCOUNTER — Encounter (HOSPITAL_COMMUNITY)
Admission: RE | Admit: 2019-03-31 | Discharge: 2019-03-31 | Disposition: A | Discharge status: Home / Self Care | Payer: 59 | Source: Ambulatory Visit | Attending: Obstetrics and Gynecology | Admitting: Obstetrics and Gynecology

## 2019-03-31 ENCOUNTER — Other Ambulatory Visit: Payer: Self-pay

## 2019-03-31 DIAGNOSIS — Z01812 Encounter for preprocedural laboratory examination: Secondary | ICD-10-CM | POA: Diagnosis not present

## 2019-03-31 HISTORY — DX: Unspecified asthma, uncomplicated: J45.909

## 2019-03-31 LAB — BASIC METABOLIC PANEL
Anion gap: 7 (ref 5–15)
BUN: 5 mg/dL — ABNORMAL LOW (ref 6–20)
CO2: 25 mmol/L (ref 22–32)
Calcium: 8.8 mg/dL — ABNORMAL LOW (ref 8.9–10.3)
Chloride: 104 mmol/L (ref 98–111)
Creatinine, Ser: 0.79 mg/dL (ref 0.44–1.00)
GFR calc Af Amer: >60 mL/min (ref >60)
GFR calc non Af Amer: >60 mL/min (ref >60)
Glucose, Bld: 98 mg/dL (ref 70–99)
Potassium: 3.7 mmol/L (ref 3.5–5.1)
Sodium: 136 mmol/L (ref 135–145)

## 2019-03-31 LAB — CBC
HCT: 47.3 % — ABNORMAL HIGH (ref 36.0–46.0)
Hemoglobin: 15.5 g/dL — ABNORMAL HIGH (ref 12.0–15.0)
MCH: 30.5 pg (ref 26.0–34.0)
MCHC: 32.8 g/dL (ref 30.0–36.0)
MCV: 92.9 fL (ref 80.0–100.0)
Platelets: 220 10*3/uL (ref 150–400)
RBC: 5.09 MIL/uL (ref 3.87–5.11)
RDW: 11.8 % (ref 11.5–15.5)
WBC: 6.5 10*3/uL (ref 4.0–10.5)
nRBC: 0 % (ref 0.0–0.2)

## 2019-03-31 NOTE — Progress Notes (Signed)
PCP - Leighton Ruff, MD Cardiologist - denies  PPM/ICD - denies  Chest x-ray - N/A EKG - N/A Stress Test - denies ECHO - denies Cardiac Cath - denies  Sleep Study - denies  Patient denies being a diabetic.  Blood Thinner Instructions: N/A   ERAS Protcol - Yes PRE-SURGERY Ensure or G2- None  COVID TEST- 04/05/2019   Anesthesia review: No  Patient denies shortness of breath, fever, cough and chest pain at PAT appointment  Coronavirus Screening  Have you experienced the following symptoms:  Cough yes/no: No Fever (>100.61F)  yes/no: No Runny nose yes/no: No Sore throat yes/no: No Difficulty breathing/shortness of breath  yes/no: No  Have you or a family member traveled in the last 14 days and where? yes/no: No   If the patient indicates "YES" to the above questions, their PAT will be rescheduled to limit the exposure to others and, the surgeon will be notified. THE PATIENT WILL NEED TO BE ASYMPTOMATIC FOR 14 DAYS.   If the patient is not experiencing any of these symptoms, the PAT nurse will instruct them to NOT bring anyone with them to their appointment since they may have these symptoms or traveled as well.   Please remind your patients and families that hospital visitation restrictions are in effect and the importance of the restrictions.    All instructions explained to the patient, with a verbal understanding of the material. Patient agrees to go over the instructions while at home for a better understanding. Patient also instructed to self quarantine after being tested for COVID-19. The opportunity to ask questions was provided.

## 2019-03-31 NOTE — Progress Notes (Signed)
Spoke with patient by phone aware surgery location now [facility], arrive 630 am 04-07-2019, arrive 630 am npo after midnight, take oxycodone as needed. Overnight stay instructions given including bring all prescription medications in original containers. Labs on chart/epic cbc, bmet 03-31-2019, needs urine pregnancy day of surgery

## 2019-04-05 ENCOUNTER — Other Ambulatory Visit (HOSPITAL_COMMUNITY)
Admission: RE | Admit: 2019-04-05 | Discharge: 2019-04-05 | Disposition: A | Discharge status: Home / Self Care | Payer: 59 | Source: Ambulatory Visit | Attending: Obstetrics and Gynecology | Admitting: Obstetrics and Gynecology

## 2019-04-05 DIAGNOSIS — Z01812 Encounter for preprocedural laboratory examination: Secondary | ICD-10-CM | POA: Diagnosis not present

## 2019-04-05 DIAGNOSIS — Z20828 Contact with and (suspected) exposure to other viral communicable diseases: Secondary | ICD-10-CM | POA: Insufficient documentation

## 2019-04-05 LAB — SARS CORONAVIRUS 2 (TAT 6-24 HRS): SARS Coronavirus 2: NEGATIVE

## 2019-04-06 NOTE — Anesthesia Preprocedure Evaluation (Addendum)
Anesthesia Evaluation  Patient identified by MRN, date of birth, ID band Patient awake    Reviewed: Allergy & Precautions, NPO status , Patient's Chart, lab work & pertinent test results  History of Anesthesia Complications Negative for: history of anesthetic complications  Airway Mallampati: I  TM Distance: >3 FB Neck ROM: Full    Dental no notable dental hx.    Pulmonary neg pulmonary ROS,    Pulmonary exam normal        Cardiovascular negative cardio ROS Normal cardiovascular exam     Neuro/Psych negative neurological ROS  negative psych ROS   GI/Hepatic negative GI ROS, Neg liver ROS,   Endo/Other  negative endocrine ROS  Renal/GU negative Renal ROS  negative genitourinary   Musculoskeletal negative musculoskeletal ROS (+)   Abdominal   Peds  Hematology negative hematology ROS (+)   Anesthesia Other Findings Day of surgery medications reviewed with patient.  Reproductive/Obstetrics Dermoid cyst                            Anesthesia Physical Anesthesia Plan  ASA: I  Anesthesia Plan: General   Post-op Pain Management:    Induction: Intravenous  PONV Risk Score and Plan: 4 or greater and Treatment may vary due to age or medical condition, Ondansetron, Dexamethasone, Midazolam and Scopolamine patch - Pre-op  Airway Management Planned: Oral ETT  Additional Equipment: None  Intra-op Plan:   Post-operative Plan: Extubation in OR  Informed Consent: I have reviewed the patients History and Physical, chart, labs and discussed the procedure including the risks, benefits and alternatives for the proposed anesthesia with the patient or authorized representative who has indicated his/her understanding and acceptance.     Dental advisory given  Plan Discussed with: CRNA  Anesthesia Plan Comments:        Anesthesia Quick Evaluation

## 2019-04-06 NOTE — H&P (Signed)
Diamond Bell is an 28 y.o. female with probable right ovarian dermoid cyst. Size by U/S 9 cm x 7 cm x 8 cm. Surgerical removal recommended to pt.   No chronic medical problems or medications. Not sexual active. Pap 2020 negative Nulligravid No prior surgeries  Menstrual History: Menarche age: 16 Patient's last menstrual period was 03/30/2019.    Past Medical History:  Diagnosis Date  . Asthma    CHILDHOOD  . Right thyroid nodule     No past surgical history on file.  Family History  Problem Relation Age of Onset  . Thyroid disease Mother     Social History:  reports that she has never smoked. She has never used smokeless tobacco. She reports current alcohol use. She reports that she does not use drugs.  Allergies:  Allergies  Allergen Reactions  . Penicillins     Pt was told she may be allergic to penicillin by her doctor but wasn't confirmed    No medications prior to admission.    Review of Systems  Constitutional: Negative.   Respiratory: Negative.   Cardiovascular: Negative.   Gastrointestinal: Negative.   Genitourinary: Negative.     Last menstrual period 03/30/2019. Physical Exam  Constitutional: She appears well-developed and well-nourished.  Cardiovascular: Normal rate, regular rhythm and normal heart sounds.  Respiratory: Effort normal and breath sounds normal.  GI: Soft. Bowel sounds are normal.  Genitourinary:    Genitourinary Comments: Nl EGBUS, uterus small mobile, non tender, adnexal fullness right > left, slightly tender     No results found for this or any previous visit (from the past 24 hour(s)).  No results found.  Assessment/Plan: Probable right ovarian dermoid cyst.  Ovarian cystectomy recommended and reviewed with pt. Will plan Exp lap d/t to size of dermoid. R/B/Post op care reviewed with pt. Pt has verbalized  understanding and agrees to proceed.   Chancy Milroy 04/06/2019, 2:42 PM

## 2019-04-07 ENCOUNTER — Encounter (HOSPITAL_BASED_OUTPATIENT_CLINIC_OR_DEPARTMENT_OTHER): Payer: Self-pay

## 2019-04-07 ENCOUNTER — Inpatient Hospital Stay (HOSPITAL_BASED_OUTPATIENT_CLINIC_OR_DEPARTMENT_OTHER): Payer: 59 | Admitting: Certified Registered"

## 2019-04-07 ENCOUNTER — Encounter (HOSPITAL_BASED_OUTPATIENT_CLINIC_OR_DEPARTMENT_OTHER)
Admission: RE | Disposition: A | Discharge status: Home / Self Care | Payer: Self-pay | Source: Home / Self Care | Attending: Obstetrics and Gynecology

## 2019-04-07 ENCOUNTER — Observation Stay (HOSPITAL_BASED_OUTPATIENT_CLINIC_OR_DEPARTMENT_OTHER)
Admission: RE | Admit: 2019-04-07 | Discharge: 2019-04-08 | Disposition: A | Discharge status: Home / Self Care | Payer: 59 | Attending: Obstetrics and Gynecology | Admitting: Obstetrics and Gynecology

## 2019-04-07 ENCOUNTER — Inpatient Hospital Stay (HOSPITAL_COMMUNITY): Admission: RE | Admit: 2019-04-07 | Payer: 59 | Source: Home / Self Care | Admitting: Obstetrics and Gynecology

## 2019-04-07 ENCOUNTER — Ambulatory Visit (HOSPITAL_BASED_OUTPATIENT_CLINIC_OR_DEPARTMENT_OTHER): Admit: 2019-04-07 | Payer: 59 | Admitting: Obstetrics and Gynecology

## 2019-04-07 ENCOUNTER — Encounter (HOSPITAL_COMMUNITY): Admission: RE | Payer: Self-pay | Source: Home / Self Care

## 2019-04-07 DIAGNOSIS — D270 Benign neoplasm of right ovary: Secondary | ICD-10-CM | POA: Diagnosis not present

## 2019-04-07 DIAGNOSIS — N736 Female pelvic peritoneal adhesions (postinfective): Secondary | ICD-10-CM | POA: Insufficient documentation

## 2019-04-07 DIAGNOSIS — Z9889 Other specified postprocedural states: Secondary | ICD-10-CM

## 2019-04-07 HISTORY — PX: SALPINGOOPHORECTOMY: SHX82

## 2019-04-07 HISTORY — PX: LAPAROTOMY: SHX154

## 2019-04-07 LAB — HEMOGLOBIN AND HEMATOCRIT, BLOOD
HCT: 42.2 % (ref 36.0–46.0)
Hemoglobin: 13.9 g/dL (ref 12.0–15.0)

## 2019-04-07 LAB — POCT PREGNANCY, URINE: Preg Test, Ur: NEGATIVE

## 2019-04-07 SURGERY — LAPAROTOMY
Anesthesia: General | Laterality: Right

## 2019-04-07 SURGERY — LAPAROTOMY
Anesthesia: Choice

## 2019-04-07 MED ORDER — OXYCODONE-ACETAMINOPHEN 5-325 MG PO TABS
2.0000 | ORAL_TABLET | ORAL | Status: DC | PRN
  Filled 2019-04-07: qty 2

## 2019-04-07 MED ORDER — HYDROMORPHONE HCL 1 MG/ML IJ SOLN
1.0000 mg | INTRAMUSCULAR | Status: DC | PRN
  Filled 2019-04-07: qty 1

## 2019-04-07 MED ORDER — LACTATED RINGERS IV SOLN
INTRAVENOUS | Status: DC
  Administered 2019-04-07: 07:00:00 via INTRAVENOUS
  Filled 2019-04-07: qty 1000

## 2019-04-07 MED ORDER — PHENYLEPHRINE 40 MCG/ML (10ML) SYRINGE FOR IV PUSH (FOR BLOOD PRESSURE SUPPORT)
PREFILLED_SYRINGE | INTRAVENOUS | Status: AC
  Filled 2019-04-07: qty 10

## 2019-04-07 MED ORDER — SCOPOLAMINE 1 MG/3DAYS TD PT72
1.0000 | MEDICATED_PATCH | Freq: Once | TRANSDERMAL | Status: DC
  Administered 2019-04-07: 1.5 mg via TRANSDERMAL
  Filled 2019-04-07: qty 1

## 2019-04-07 MED ORDER — KETAMINE HCL 10 MG/ML IJ SOLN
INTRAMUSCULAR | Status: DC | PRN
  Administered 2019-04-07 (×2): 10 mg via INTRAVENOUS
  Administered 2019-04-07: 5 mg via INTRAVENOUS

## 2019-04-07 MED ORDER — SUGAMMADEX SODIUM 200 MG/2ML IV SOLN
INTRAVENOUS | Status: DC | PRN
  Administered 2019-04-07: 120 mg via INTRAVENOUS

## 2019-04-07 MED ORDER — PHENYLEPHRINE 40 MCG/ML (10ML) SYRINGE FOR IV PUSH (FOR BLOOD PRESSURE SUPPORT)
PREFILLED_SYRINGE | INTRAVENOUS | Status: DC | PRN
  Administered 2019-04-07: 80 ug via INTRAVENOUS

## 2019-04-07 MED ORDER — ACETAMINOPHEN 500 MG PO TABS
ORAL_TABLET | ORAL | Status: AC
  Filled 2019-04-07: qty 2

## 2019-04-07 MED ORDER — CEFAZOLIN SODIUM-DEXTROSE 2-4 GM/100ML-% IV SOLN
INTRAVENOUS | Status: AC
  Filled 2019-04-07: qty 100

## 2019-04-07 MED ORDER — ONDANSETRON HCL 4 MG PO TABS
4.0000 mg | ORAL_TABLET | Freq: Four times a day (QID) | ORAL | Status: DC | PRN
  Filled 2019-04-07: qty 1

## 2019-04-07 MED ORDER — DEXAMETHASONE SODIUM PHOSPHATE 10 MG/ML IJ SOLN
INTRAMUSCULAR | Status: DC | PRN
  Administered 2019-04-07: 8 mg via INTRAVENOUS

## 2019-04-07 MED ORDER — OXYCODONE-ACETAMINOPHEN 5-325 MG PO TABS
1.0000 | ORAL_TABLET | ORAL | Status: DC | PRN
  Administered 2019-04-08: 1 via ORAL
  Filled 2019-04-07: qty 1

## 2019-04-07 MED ORDER — ZOLPIDEM TARTRATE 5 MG PO TABS
5.0000 mg | ORAL_TABLET | Freq: Every evening | ORAL | Status: DC | PRN
  Filled 2019-04-07: qty 1

## 2019-04-07 MED ORDER — LIDOCAINE 2% (20 MG/ML) 5 ML SYRINGE
INTRAMUSCULAR | Status: DC | PRN
  Administered 2019-04-07: 40 mg via INTRAVENOUS

## 2019-04-07 MED ORDER — FENTANYL CITRATE (PF) 100 MCG/2ML IJ SOLN
INTRAMUSCULAR | Status: AC
  Filled 2019-04-07: qty 2

## 2019-04-07 MED ORDER — SCOPOLAMINE 1 MG/3DAYS TD PT72
MEDICATED_PATCH | TRANSDERMAL | Status: AC
  Filled 2019-04-07: qty 1

## 2019-04-07 MED ORDER — ROCURONIUM BROMIDE 10 MG/ML (PF) SYRINGE
PREFILLED_SYRINGE | INTRAVENOUS | Status: AC
  Filled 2019-04-07: qty 10

## 2019-04-07 MED ORDER — KETOROLAC TROMETHAMINE 30 MG/ML IJ SOLN
INTRAMUSCULAR | Status: AC
  Filled 2019-04-07: qty 1

## 2019-04-07 MED ORDER — MENTHOL 3 MG MT LOZG
1.0000 | LOZENGE | OROMUCOSAL | Status: DC | PRN
  Filled 2019-04-07: qty 9

## 2019-04-07 MED ORDER — 0.9 % SODIUM CHLORIDE (POUR BTL) OPTIME
TOPICAL | Status: DC | PRN
  Administered 2019-04-07: 1000 mL

## 2019-04-07 MED ORDER — ONDANSETRON HCL 4 MG/2ML IJ SOLN
INTRAMUSCULAR | Status: AC
  Filled 2019-04-07: qty 2

## 2019-04-07 MED ORDER — CEFAZOLIN SODIUM-DEXTROSE 2-4 GM/100ML-% IV SOLN
2.0000 g | INTRAVENOUS | Status: AC
  Administered 2019-04-07: 08:00:00 2 g via INTRAVENOUS
  Filled 2019-04-07: qty 100

## 2019-04-07 MED ORDER — LACTATED RINGERS IV SOLN
INTRAVENOUS | Status: DC
  Administered 2019-04-07: 20:00:00 via INTRAVENOUS
  Filled 2019-04-07 (×2): qty 1000

## 2019-04-07 MED ORDER — SOD CITRATE-CITRIC ACID 500-334 MG/5ML PO SOLN
30.0000 mL | ORAL | Status: DC
  Filled 2019-04-07: qty 30

## 2019-04-07 MED ORDER — KETOROLAC TROMETHAMINE 15 MG/ML IJ SOLN
15.0000 mg | INTRAMUSCULAR | Status: AC
  Administered 2019-04-07: 15 mg via INTRAVENOUS
  Filled 2019-04-07: qty 1

## 2019-04-07 MED ORDER — DEXAMETHASONE SODIUM PHOSPHATE 10 MG/ML IJ SOLN
INTRAMUSCULAR | Status: AC
  Filled 2019-04-07: qty 1

## 2019-04-07 MED ORDER — PROPOFOL 10 MG/ML IV BOLUS
INTRAVENOUS | Status: AC
  Filled 2019-04-07: qty 20

## 2019-04-07 MED ORDER — ONDANSETRON HCL 4 MG/2ML IJ SOLN
INTRAMUSCULAR | Status: DC | PRN
  Administered 2019-04-07: 4 mg via INTRAVENOUS

## 2019-04-07 MED ORDER — ACETAMINOPHEN 500 MG PO TABS
1000.0000 mg | ORAL_TABLET | Freq: Once | ORAL | Status: DC
  Filled 2019-04-07: qty 2

## 2019-04-07 MED ORDER — FENTANYL CITRATE (PF) 100 MCG/2ML IJ SOLN
INTRAMUSCULAR | Status: DC | PRN
  Administered 2019-04-07 (×2): 50 ug via INTRAVENOUS

## 2019-04-07 MED ORDER — ACETAMINOPHEN 500 MG PO TABS
1000.0000 mg | ORAL_TABLET | ORAL | Status: AC
  Administered 2019-04-07: 1000 mg via ORAL
  Filled 2019-04-07: qty 2

## 2019-04-07 MED ORDER — KETAMINE HCL 10 MG/ML IJ SOLN
INTRAMUSCULAR | Status: AC
  Filled 2019-04-07: qty 1

## 2019-04-07 MED ORDER — KETOROLAC TROMETHAMINE 30 MG/ML IJ SOLN
30.0000 mg | Freq: Once | INTRAMUSCULAR | Status: AC | PRN
  Administered 2019-04-07: 15 mg via INTRAVENOUS
  Filled 2019-04-07: qty 1

## 2019-04-07 MED ORDER — SIMETHICONE 80 MG PO CHEW
80.0000 mg | CHEWABLE_TABLET | Freq: Four times a day (QID) | ORAL | Status: DC | PRN
  Filled 2019-04-07: qty 1

## 2019-04-07 MED ORDER — ROCURONIUM BROMIDE 10 MG/ML (PF) SYRINGE
PREFILLED_SYRINGE | INTRAVENOUS | Status: DC | PRN
  Administered 2019-04-07: 40 mg via INTRAVENOUS

## 2019-04-07 MED ORDER — LIDOCAINE 2% (20 MG/ML) 5 ML SYRINGE
INTRAMUSCULAR | Status: AC
  Filled 2019-04-07: qty 5

## 2019-04-07 MED ORDER — MIDAZOLAM HCL 2 MG/2ML IJ SOLN
INTRAMUSCULAR | Status: AC
  Filled 2019-04-07: qty 2

## 2019-04-07 MED ORDER — IBUPROFEN 800 MG PO TABS
800.0000 mg | ORAL_TABLET | Freq: Three times a day (TID) | ORAL | Status: DC
  Filled 2019-04-07: qty 1

## 2019-04-07 MED ORDER — KETOROLAC TROMETHAMINE 30 MG/ML IJ SOLN
30.0000 mg | Freq: Four times a day (QID) | INTRAMUSCULAR | Status: DC
  Administered 2019-04-07 – 2019-04-08 (×3): 30 mg via INTRAVENOUS
  Filled 2019-04-07: qty 1

## 2019-04-07 MED ORDER — ONDANSETRON HCL 4 MG/2ML IJ SOLN
4.0000 mg | Freq: Four times a day (QID) | INTRAMUSCULAR | Status: DC | PRN
  Filled 2019-04-07: qty 2

## 2019-04-07 MED ORDER — FENTANYL CITRATE (PF) 100 MCG/2ML IJ SOLN
25.0000 ug | INTRAMUSCULAR | Status: DC | PRN
  Administered 2019-04-07 (×2): 25 ug via INTRAVENOUS
  Filled 2019-04-07: qty 1

## 2019-04-07 MED ORDER — LIDOCAINE 2% (20 MG/ML) 5 ML SYRINGE
INTRAMUSCULAR | Status: DC | PRN
  Administered 2019-04-07: 1.5 mg/kg/h via INTRAVENOUS

## 2019-04-07 MED ORDER — LACTATED RINGERS IV SOLN
INTRAVENOUS | Status: DC
  Administered 2019-04-07 – 2019-04-08 (×2): via INTRAVENOUS
  Filled 2019-04-07 (×2): qty 1000

## 2019-04-07 MED ORDER — BUPIVACAINE HCL (PF) 0.5 % IJ SOLN
INTRAMUSCULAR | Status: AC
  Filled 2019-04-07: qty 30

## 2019-04-07 MED ORDER — OXYCODONE HCL 5 MG PO TABS
5.0000 mg | ORAL_TABLET | Freq: Once | ORAL | Status: DC | PRN
  Filled 2019-04-07: qty 1

## 2019-04-07 MED ORDER — KETOROLAC TROMETHAMINE 15 MG/ML IJ SOLN
15.0000 mg | Freq: Once | INTRAMUSCULAR | Status: DC
  Filled 2019-04-07: qty 1

## 2019-04-07 MED ORDER — PROMETHAZINE HCL 25 MG/ML IJ SOLN
6.2500 mg | INTRAMUSCULAR | Status: DC | PRN
  Filled 2019-04-07: qty 1

## 2019-04-07 MED ORDER — MIDAZOLAM HCL 2 MG/2ML IJ SOLN
INTRAMUSCULAR | Status: DC | PRN
  Administered 2019-04-07: 2 mg via INTRAVENOUS

## 2019-04-07 MED ORDER — PROPOFOL 10 MG/ML IV BOLUS
INTRAVENOUS | Status: DC | PRN
  Administered 2019-04-07: 30 mg via INTRAVENOUS
  Administered 2019-04-07: 120 mg via INTRAVENOUS
  Administered 2019-04-07: 20 mg via INTRAVENOUS

## 2019-04-07 MED ORDER — OXYCODONE HCL 5 MG/5ML PO SOLN
5.0000 mg | Freq: Once | ORAL | Status: DC | PRN
  Filled 2019-04-07: qty 5

## 2019-04-07 SURGICAL SUPPLY — 47 items
BARRIER ADHS 3X4 INTERCEED (GAUZE/BANDAGES/DRESSINGS) IMPLANT
BENZOIN TINCTURE PRP APPL 2/3 (GAUZE/BANDAGES/DRESSINGS) ×3 IMPLANT
CANISTER SUCT 3000ML PPV (MISCELLANEOUS) ×3 IMPLANT
COVER WAND RF STERILE (DRAPES) ×3 IMPLANT
DRAPE WARM FLUID 44X44 (DRAPES) IMPLANT
DRSG OPSITE POSTOP 4X10 (GAUZE/BANDAGES/DRESSINGS) ×3 IMPLANT
DURAPREP 26ML APPLICATOR (WOUND CARE) ×3 IMPLANT
GAUZE 4X4 16PLY RFD (DISPOSABLE) ×3 IMPLANT
GAUZE SPONGE 4X4 16PLY XRAY LF (GAUZE/BANDAGES/DRESSINGS) ×3 IMPLANT
GLOVE BIO SURGEON STRL SZ7.5 (GLOVE) ×6 IMPLANT
GLOVE BIOGEL PI IND STRL 7.0 (GLOVE) ×4 IMPLANT
GLOVE BIOGEL PI INDICATOR 7.0 (GLOVE) ×2
GOWN STRL REUS W/ TWL LRG LVL3 (GOWN DISPOSABLE) ×4 IMPLANT
GOWN STRL REUS W/ TWL XL LVL3 (GOWN DISPOSABLE) ×2 IMPLANT
GOWN STRL REUS W/TWL LRG LVL3 (GOWN DISPOSABLE) ×2
GOWN STRL REUS W/TWL XL LVL3 (GOWN DISPOSABLE) ×1
KIT TURNOVER KIT B (KITS) ×3 IMPLANT
NEEDLE HYPO 22GX1.5 SAFETY (NEEDLE) ×3 IMPLANT
NS IRRIG 1000ML POUR BTL (IV SOLUTION) ×3 IMPLANT
PACK ABDOMINAL GYN (CUSTOM PROCEDURE TRAY) ×3 IMPLANT
PAD OB MATERNITY 4.3X12.25 (PERSONAL CARE ITEMS) ×3 IMPLANT
SPECIMEN JAR MEDIUM (MISCELLANEOUS) ×3 IMPLANT
SPONGE INTESTINAL PEANUT (DISPOSABLE) IMPLANT
SPONGE LAP 18X18 RF (DISPOSABLE) ×6 IMPLANT
STRIP CLOSURE SKIN 1/2X4 (GAUZE/BANDAGES/DRESSINGS) ×3 IMPLANT
STRIP CLOSURE SKIN 1/4X3 (GAUZE/BANDAGES/DRESSINGS) ×1 IMPLANT
SUT PLAIN 2 0 XLH (SUTURE) ×3 IMPLANT
SUT VIC AB 0 CT1 18XCR BRD8 (SUTURE) IMPLANT
SUT VIC AB 0 CT1 27 (SUTURE)
SUT VIC AB 0 CT1 27XBRD ANBCTR (SUTURE) IMPLANT
SUT VIC AB 0 CT1 8-18 (SUTURE)
SUT VIC AB 1 CT1 18XBRD ANBCTR (SUTURE) IMPLANT
SUT VIC AB 1 CT1 36 (SUTURE) IMPLANT
SUT VIC AB 1 CT1 8-18 (SUTURE)
SUT VIC AB 1 CTX 36 (SUTURE) ×2
SUT VIC AB 1 CTX36XBRD ANBCTRL (SUTURE) ×4 IMPLANT
SUT VIC AB 3-0 CT1 27 (SUTURE) ×1
SUT VIC AB 3-0 CT1 TAPERPNT 27 (SUTURE) IMPLANT
SUT VIC AB 3-0 SH 27 (SUTURE)
SUT VIC AB 3-0 SH 27X BRD (SUTURE) IMPLANT
SUT VIC AB 4-0 KS 27 (SUTURE) ×3 IMPLANT
SUT VICRYL 1 TIES 12X18 (SUTURE) ×3 IMPLANT
SYR BULB IRRIGATION 50ML (SYRINGE) ×1 IMPLANT
SYR CONTROL 10ML LL (SYRINGE) ×3 IMPLANT
TOWEL OR 17X26 10 PK STRL BLUE (TOWEL DISPOSABLE) ×6 IMPLANT
TRAP SPECIMEN MUCOUS 40CC (MISCELLANEOUS) ×1 IMPLANT
TRAY FOLEY W/BAG SLVR 14FR (SET/KITS/TRAYS/PACK) ×3 IMPLANT

## 2019-04-07 NOTE — Op Note (Signed)
PROCEDURE DATE:04/07/2019  PREOPERATIVE DIAGNOSIS:  Probable ovarian dermoid cyst POSTOPERATIVE DIAGNOSIS:  Torted right ovarian with possible hemorraghic cyst SURGEON:   Arlina Robes, M.D. ASSISTANT: Blanch Media , M.D. ANESTHESIOLOGISTTamsen Snider, M.D. OPERATION:  Exploratory laparotomy,  right Salpingoohorectomy ANESTHESIA:  General endotracheal.  INDICATIONS: The patient is a 28 y.o. No obstetric history on file. with  probable ovarian dermoid cyst identified by U/S, who desired definitive surgical management. On the preoperative visit, the risks, benefits, indications, and alternatives of the procedure were reviewed with the patient.  On the day of surgery, the risks of surgery were again discussed with the patient including but not limited to: bleeding which may require transfusion or reoperation; infection which may require antibiotics; injury to bowel, bladder, ureters or other surrounding organs; need for additional procedures; thromboembolic phenomenon, incisional problems and other postoperative/anesthesia complications. Written informed consent was obtained.    OPERATIVE FINDINGS: A 6 week size uterus with normal left fallopian tube and ovary. Probable torted right ovarian hemorraghic cyst. Liver surface normal feeling. No evidence of omental caking.  No other anomalies noted in pelvis, no lesions of endometriosis.    ESTIMATED BLOOD LOSS: 25 ml FLUIDS:  As recorded  URINE OUTPUT:  As recorded SPECIMENS:  Right fallopian tube and ovary sent to pathology COMPLICATIONS:  None immediate.  DESCRIPTION OF PROCEDURE:  The patient received intravenous antibiotics and had sequential compression devices applied to her lower extremities while in the preoperative area.   She was taken to the operating room and placed under general anesthesia without difficulty.The abdomen and perineum were prepped and draped in a sterile manner, and she was placed in a dorsal supine position.  A Foley  catheter was inserted into the bladder and attached to constant drainage. After an adequate timeout was performed, a Pfannensteil skin incision was made. This incision was taken down to the fascia using electrocautery with care given to maintain good hemostasis. The fascia was incised in the midline and the fascial incision was then extended bilaterally using electrocautery without difficulty. The fascia was then dissected off the underlying rectus muscles using blunt and sharp dissection. The rectus muscles were split bluntly in the midline and the peritoneum entered sharply without complication. This peritoneal incision was then extended superiorly and inferiorly with care given to prevent bowel or bladder injury.  Attention was then turned to the pelvis. The bowel was packed away with moist laparotomy sponges. The left ovary at this point was noted to be mobile and normal in appearance, as was the uterus itself. The right tube and ovarian were enlarged @ 8 cm and adhered to the posterior  of the uterus in the cul de sac. The ovary appeared dark and hemorraghic, there were small excretions noted on the right fimbriae. Pelvic washings were obtained. The reminder of the pelvic and abdomen inspection are as noted.  With blunt dissection the right ovary and tube were freed and elevated through the abdomen incision. The right ureter was identified.  The right  infundibulopelvic ligament was clamped on the patient's right  cut, and doubly suture ligated with 0 Vicryl.  The pelvis was irrigated and hemostasis was reconfirmed.   All laparotomy sponges and instruments were removed from the abdomen. The peritoneum and abdomen muscles were closed with a 2-0 Vicryl running stitch.  The fascia was also closed in a running fashion with 0 Vicryl. The subcutaneous layer was irrigated and injected with 30 ml of 0.5% Marcaine. The skin was closed with a 4-0 Vicryl  subcuticular stitch. Sponge, lap, needle, and instrument counts were  correct times two. The patient was taken to the recovery area awake, extubated and in stable condition.  An experienced assistant was required given the standard of surgical care given the complexity of the case.  This assistant was needed for exposure, dissection, suctioning, retraction, and for overall help during the procedure.  Arlina Robes, MD, [place] Attending [place], [facility]

## 2019-04-07 NOTE — Anesthesia Postprocedure Evaluation (Signed)
Anesthesia Post Note  Patient: Leti Wilkerson  Procedure(s) Performed: EXPLORATORY LAPAROTOMY WITH PELVIC WASHINGS (N/A ) OPEN SALPINGO OOPHORECTOMY WITH REMOVAL OF OVARIAN CYST (Right )     Patient location during evaluation: PACU Anesthesia Type: General Level of consciousness: awake and alert and oriented Pain management: pain level controlled Vital Signs Assessment: post-procedure vital signs reviewed and stable Respiratory status: spontaneous breathing, nonlabored ventilation and respiratory function stable Cardiovascular status: blood pressure returned to baseline Postop Assessment: no apparent nausea or vomiting Anesthetic complications: no    Last Vitals:  Vitals:   04/07/19 1030 04/07/19 1045  BP:  111/71  Pulse: 90 71  Resp: 15 19  Temp:  37.2 C  SpO2: 100% 100%    Last Pain:  Vitals:   04/07/19 1045  TempSrc:   PainSc: [place]

## 2019-04-07 NOTE — Anesthesia Procedure Notes (Signed)
Procedure Name: Intubation Date/Time: 04/07/2019 8:26 AM Performed by: Niel Hummer, CRNA Pre-anesthesia Checklist: Patient identified, Emergency Drugs available, Suction available and Patient being monitored Patient Re-evaluated:Patient Re-evaluated prior to induction Oxygen Delivery Method: Circle system utilized Preoxygenation: Pre-oxygenation with 100% oxygen Induction Type: IV induction Ventilation: Mask ventilation without difficulty Laryngoscope Size: Mac and 4 Grade View: Grade I Tube type: Oral Tube size: 7.0 mm Airway Equipment and Method: Stylet Placement Confirmation: positive ETCO2,  ETT inserted through vocal cords under direct vision and breath sounds checked- equal and bilateral Secured at: 22 cm Tube secured with: Tape Dental Injury: Teeth and Oropharynx as per pre-operative assessment

## 2019-04-07 NOTE — Transfer of Care (Signed)
Immediate Anesthesia Transfer of Care Note  Patient: Diamond Bell  Procedure(s) Performed: EXPLORATORY LAPAROTOMY WITH PELVIC WASHINGS (N/A ) OPEN SALPINGO OOPHORECTOMY WITH REMOVAL OF OVARIAN CYST (Right )  Patient Location: PACU  Anesthesia Type:General  Level of Consciousness: awake, alert  and oriented  Airway & Oxygen Therapy: Patient Spontanous Breathing and Patient connected to face mask oxygen  Post-op Assessment: Report given to RN and Post -op Vital signs reviewed and stable  Post vital signs: Reviewed and stable  Last Vitals:  Vitals Value Taken Time  BP    Temp    Pulse 92 04/07/19 0934  Resp 20 04/07/19 0934  SpO2 100 % 04/07/19 0934  Vitals shown include unvalidated device data.  Last Pain:  Vitals:   04/07/19 0657  TempSrc: Oral  PainSc: 0-No pain      Patients Stated Pain Goal: 3 ([id number])  Complications: No apparent anesthesia complications

## 2019-04-07 NOTE — Interval H&P Note (Signed)
History and Physical Interval Note:  04/07/2019 8:09 AM  Diamond Bell  has presented today for surgery, with the diagnosis of Dermoid Cyst.  The various methods of treatment have been discussed with the patient and family. After consideration of risks, benefits and other options for treatment, the patient has consented to  Procedure(s): LAPAROTOMY (N/A) as a surgical intervention.  The patient's history has been reviewed, patient examined, no change in status, stable for surgery.  I have reviewed the patient's chart and labs.  Questions were answered to the patient's satisfaction.     Chancy Milroy

## 2019-04-08 ENCOUNTER — Encounter (HOSPITAL_BASED_OUTPATIENT_CLINIC_OR_DEPARTMENT_OTHER): Payer: Self-pay | Admitting: Obstetrics and Gynecology

## 2019-04-08 DIAGNOSIS — D270 Benign neoplasm of right ovary: Secondary | ICD-10-CM | POA: Diagnosis not present

## 2019-04-08 LAB — CBC
HCT: 39.3 % (ref 36.0–46.0)
Hemoglobin: 12.6 g/dL (ref 12.0–15.0)
MCH: 30.1 pg (ref 26.0–34.0)
MCHC: 32.1 g/dL (ref 30.0–36.0)
MCV: 93.8 fL (ref 80.0–100.0)
Platelets: 222 10*3/uL (ref 150–400)
RBC: 4.19 MIL/uL (ref 3.87–5.11)
RDW: 11.9 % (ref 11.5–15.5)
WBC: 9.4 10*3/uL (ref 4.0–10.5)
nRBC: 0 % (ref 0.0–0.2)

## 2019-04-08 LAB — BASIC METABOLIC PANEL
Anion gap: 8 (ref 5–15)
BUN: 11 mg/dL (ref 6–20)
CO2: 24 mmol/L (ref 22–32)
Calcium: 8.4 mg/dL — ABNORMAL LOW (ref 8.9–10.3)
Chloride: 107 mmol/L (ref 98–111)
Creatinine, Ser: 0.62 mg/dL (ref 0.44–1.00)
GFR calc Af Amer: >60 mL/min (ref >60)
GFR calc non Af Amer: >60 mL/min (ref >60)
Glucose, Bld: 99 mg/dL (ref 70–99)
Potassium: 3.8 mmol/L (ref 3.5–5.1)
Sodium: 139 mmol/L (ref 135–145)

## 2019-04-08 LAB — SURGICAL PATHOLOGY

## 2019-04-08 LAB — CYTOLOGY - NON PAP

## 2019-04-08 MED ORDER — KETOROLAC TROMETHAMINE 30 MG/ML IJ SOLN
INTRAMUSCULAR | Status: AC
  Filled 2019-04-08: qty 1

## 2019-04-08 MED ORDER — OXYCODONE-ACETAMINOPHEN 5-325 MG PO TABS
ORAL_TABLET | ORAL | Status: AC
  Filled 2019-04-08: qty 1

## 2019-04-08 NOTE — Discharge Instructions (Signed)
Exploratory Laparotomy, Adult, Care After °This sheet gives you information about how to care for yourself after your procedure. Your health care provider may also give you more specific instructions. If you have problems or questions, contact your health care provider. °What can I expect after the procedure? °After the procedure, it is common to have: °· Abdominal soreness. °· Fatigue. °· A sore throat from the tube in your throat. °· A lack of appetite. °Follow these instructions at home: °Medicines °· Take over-the-counter and prescription medicines only as told by your health care provider. °· If you were prescribed an antibiotic medicine, take it as told by your health care provider. Do not stop taking the antibiotic even if you start to feel better. °· Do not drive or operate heavy machinery while taking pain medicine. °· If you are taking prescription pain medicine, take actions to prevent or treat constipation. Your health care provider may recommend that you: °? Drink enough fluid to keep your urine pale yellow. °? Eat foods that are high in fiber, such as fresh fruits and vegetables, whole grains, and beans. °? Limit foods that are high in fat and processed sugars, such as fried or sweet foods. °? Take an over-the-counter or prescription medicine for constipation. Undergoing surgery and taking pain medicines can make constipation worse. °Incision care ° °· Follow instructions from your health care provider about how to take care of your incision. Make sure you: °? Wash your hands with soap and water before you change your bandage (dressing). If soap and water are not available, use hand sanitizer. °? Change your dressing as told by your health care provider. °? Leave stitches (sutures), skin glue, or adhesive strips in place. These skin closures may need to stay in place for 2 weeks or longer. If adhesive strip edges start to loosen and curl up, you may trim the loose edges. Do not remove adhesive strips  completely unless your health care provider tells you to do that. °· If you were sent home with a drain, follow instructions from your health care provider about how to care for it. °· Check your incision area every day for signs of infection. Check for: °? Redness, swelling, or pain. °? Fluid or blood. °? Warmth. °? Pus or a bad smell. °Activity ° °· Rest as told by your health care provider. °? Avoid sitting for a long time without moving. Get up to take short walks every 1-2 hours. This is important to improve blood flow and breathing. Ask for help if you feel weak or unsteady. °· Do not lift anything that is heavier than 5 lb (2.2 kg), or the limit that your health care provider tells you, until he or she says that it is safe. °· Ask your health care provider when you can start to do your usual activities again, such as driving, going back to work, and having sex. °Eating and drinking °· You may eat what you usually eat. Include lots of whole grains, fruits, and vegetables in your diet. This will help to prevent constipation. °· Drink enough fluid to keep your urine pale yellow. °Bathing °· Keep your incision clean and dry. Clean it as often as told by your health care provider: °? Gently wash the incision with soap and water. °? Rinse the incision with water to remove all soap. °? Pat the incision dry with a clean towel. Do not rub the incision. °· You may take showers after 48 hours. °· Do not take baths,   swim, or use a hot tub until your health care provider says it is okay to do so. General instructions  Do not use any products that contain nicotine or tobacco, such as cigarettes and e-cigarettes. These can delay healing after surgery. If you need help quitting, ask your health care provider.  Wear compression stockings as told by your health care provider. These stockings help to prevent blood clots and reduce swelling in your legs.  Keep all follow-up visits as told by your health care provider.  This is important. Contact a health care provider if:  You have a fever.  You have chills.  Your pain medicine is not helping.  You have constipation or diarrhea.  You have nausea or vomiting.  You have drainage, redness, swelling, or pain at your incision site. Get help right away if:  Your pain is getting worse.  You have not had a bowel movement for more than 3 days.  You have ongoing (persistent) vomiting.  The edges of your incision open up.  You have warmth, tenderness, and swelling in your calf.  You have trouble breathing.  You have chest pain. These symptoms may represent a serious problem that is an emergency. Do not wait to see if the symptoms will go away. Get medical help right away. Call your local emergency services (911 in the [place]). Do not drive yourself to the hospital. Summary  Abdominal soreness is common after exploratory laparotomy. Take over-the-counter and prescription pain medicines only as told by your health care provider.  Follow instructions from your health care provider about how to take care of your incision. Do not take baths, swim, or use a hot tub until your health care provider says it is okay to do so.  Watch for signs and symptoms of infection after surgery, including fever, chills, drainage from your incision, and worsening abdominal pain. This information is not intended to replace advice given to you by your health care provider. Make sure you discuss any questions you have with your health care provider. Document Released: 12/05/2003 Document Revised: 06/15/2018 Document Reviewed: 05/02/2017 Elsevier Patient Education  2020 [employer].

## 2019-04-08 NOTE — Progress Notes (Signed)
Dr. Rip Harbour in to see pt.  No new bleeding noted since RN from yesterday reinforced dsg to abd.  Dsg removed now; steri strips placed and honeycomb placed over steris as ordered.  Instructed pt on showing and what to look for in the event of infection.

## 2019-04-08 NOTE — Discharge Summary (Signed)
Physician Discharge Summary   Patient ID: Diamond Bell [id number] y.o. 07-13-1990  Admit date: 04/07/2019  Discharge date and time:  04/08/2019  Admitting Physician: Chancy Milroy, MD   Discharge Physician: SAA  Admission Diagnoses: Dermoid Cyst  Discharge Diagnoses: Probable torted ovary  Admission Condition: good  Discharged Condition: good  Indication for Admission: As above  Hospital Course: [name] was admitted with probable ovarian dermoid cyst. She underwent Ex lap with RSO. See OP note for additional information. Her post op course was unremarkable. She progressed to ambulating, voiding, tolerating diet and good oral pain control. Felt amendable for discharge home on POD # 1. Discharge instructions, medications, and follow up were reviewed with pt. She verbalized understanding and was discharged home.   Consults: None  Significant Diagnostic Studies: labs:   Treatments: surgery: Ex Lap with RSO  Discharge Exam: AF VSS Lungs clear Heart RRR Abd soft + BS  Drsg intact Ext non tender  Disposition: Discharge disposition: 01-Home or Self Care       Patient Instructions:  Allergies as of 04/08/2019      Reactions   Penicillins    Pt was told she may be allergic to penicillin by her doctor but wasn't confirmed      Medication List    TAKE these medications   cholecalciferol 25 MCG (1000 UT) tablet Commonly known as: VITAMIN D3 Take 1,000 Units by mouth daily.   ibuprofen 800 MG tablet Commonly known as: ADVIL Take 1 tablet (800 mg total) by mouth every 8 (eight) hours as needed.   multivitamin tablet Take 1 tablet by mouth daily.   oxyCODONE-acetaminophen 5-325 MG tablet Commonly known as: PERCOCET/ROXICET Take 1 tablet by mouth every 6 (six) hours as needed for severe pain.   vitamin C 1000 MG tablet Take 1,000 mg by mouth daily.      Activity: As reviewed Diet: regular diet Wound Care: Remove dressing in 1 week  Follow-up with 4  weeks   Signed: Chancy Milroy 04/08/2019 7:15 AM

## 2019-04-21 ENCOUNTER — Encounter: Payer: Self-pay | Admitting: General Practice

## 2019-04-21 ENCOUNTER — Telehealth: Payer: Self-pay | Admitting: General Practice

## 2019-04-21 NOTE — Telephone Encounter (Signed)
Patient called and left message on nurse voicemail line stating she needs a note to return back to work. Per chart review, Dr Rip Harbour states patient can return to work 2 weeks after surgery if patient feels up to it.   Called patient and asked if she felt ready to go back to work and she states yes. Told patient I would create a letter with a return to work date of 12/21 in which she could view/print out in [place]. Patient verbalized understanding.

## 2019-04-26 DIAGNOSIS — Z029 Encounter for administrative examinations, unspecified: Secondary | ICD-10-CM

## 2019-05-13 ENCOUNTER — Other Ambulatory Visit: Payer: Self-pay

## 2019-05-13 ENCOUNTER — Encounter: Payer: Self-pay | Admitting: Obstetrics and Gynecology

## 2019-05-13 ENCOUNTER — Ambulatory Visit (INDEPENDENT_AMBULATORY_CARE_PROVIDER_SITE_OTHER): Payer: 59 | Admitting: Obstetrics and Gynecology

## 2019-05-13 VITALS — BP 120/80 | HR 71 | Ht 66.0 in | Wt 131.0 lb

## 2019-05-13 DIAGNOSIS — Z9889 Other specified postprocedural states: Secondary | ICD-10-CM

## 2019-05-13 NOTE — Patient Instructions (Signed)
Health Maintenance, Female Adopting a healthy lifestyle and getting preventive care are important in promoting health and wellness. Ask your health care provider about:  The right schedule for you to have regular tests and exams.  Things you can do on your own to prevent diseases and keep yourself healthy. What should I know about diet, weight, and exercise? Eat a healthy diet   Eat a diet that includes plenty of vegetables, fruits, low-fat dairy products, and lean protein.  Do not eat a lot of foods that are high in solid fats, added sugars, or sodium. Maintain a healthy weight Body mass index (BMI) is used to identify weight problems. It estimates body fat based on height and weight. Your health care provider can help determine your BMI and help you achieve or maintain a healthy weight. Get regular exercise Get regular exercise. This is one of the most important things you can do for your health. Most adults should:  Exercise for at least 150 minutes each week. The exercise should increase your heart rate and make you sweat (moderate-intensity exercise).  Do strengthening exercises at least twice a week. This is in addition to the moderate-intensity exercise.  Spend less time sitting. Even light physical activity can be beneficial. Watch cholesterol and blood lipids Have your blood tested for lipids and cholesterol at 28 years of age, then have this test every 5 years. Have your cholesterol levels checked more often if:  Your lipid or cholesterol levels are high.  You are older than 28 years of age.  You are at high risk for heart disease. What should I know about cancer screening? Depending on your health history and family history, you may need to have cancer screening at various ages. This may include screening for:  Breast cancer.  Cervical cancer.  Colorectal cancer.  Skin cancer.  Lung cancer. What should I know about heart disease, diabetes, and high blood  pressure? Blood pressure and heart disease  High blood pressure causes heart disease and increases the risk of stroke. This is more likely to develop in people who have high blood pressure readings, are of African descent, or are overweight.  Have your blood pressure checked: ? Every 3-5 years if you are 18-39 years of age. ? Every year if you are 40 years old or older. Diabetes Have regular diabetes screenings. This checks your fasting blood sugar level. Have the screening done:  Once every three years after age 40 if you are at a normal weight and have a low risk for diabetes.  More often and at a younger age if you are overweight or have a high risk for diabetes. What should I know about preventing infection? Hepatitis B If you have a higher risk for hepatitis B, you should be screened for this virus. Talk with your health care provider to find out if you are at risk for hepatitis B infection. Hepatitis C Testing is recommended for:  Everyone born from 1945 through 1965.  Anyone with known risk factors for hepatitis C. Sexually transmitted infections (STIs)  Get screened for STIs, including gonorrhea and chlamydia, if: ? You are sexually active and are younger than 28 years of age. ? You are older than 28 years of age and your health care provider tells you that you are at risk for this type of infection. ? Your sexual activity has changed since you were last screened, and you are at increased risk for chlamydia or gonorrhea. Ask your health care provider if   you are at risk.  Ask your health care provider about whether you are at high risk for HIV. Your health care provider may recommend a prescription medicine to help prevent HIV infection. If you choose to take medicine to prevent HIV, you should first get tested for HIV. You should then be tested every 3 months for as long as you are taking the medicine. Pregnancy  If you are about to stop having your period (premenopausal) and  you may become pregnant, seek counseling before you get pregnant.  Take 400 to 800 micrograms (mcg) of folic acid every day if you become pregnant.  Ask for birth control (contraception) if you want to prevent pregnancy. Osteoporosis and menopause Osteoporosis is a disease in which the bones lose minerals and strength with aging. This can result in bone fractures. If you are 65 years old or older, or if you are at risk for osteoporosis and fractures, ask your health care provider if you should:  Be screened for bone loss.  Take a calcium or vitamin D supplement to lower your risk of fractures.  Be given hormone replacement therapy (HRT) to treat symptoms of menopause. Follow these instructions at home: Lifestyle  Do not use any products that contain nicotine or tobacco, such as cigarettes, e-cigarettes, and chewing tobacco. If you need help quitting, ask your health care provider.  Do not use street drugs.  Do not share needles.  Ask your health care provider for help if you need support or information about quitting drugs. Alcohol use  Do not drink alcohol if: ? Your health care provider tells you not to drink. ? You are pregnant, may be pregnant, or are planning to become pregnant.  If you drink alcohol: ? Limit how much you use to 0-1 drink a day. ? Limit intake if you are breastfeeding.  Be aware of how much alcohol is in your drink. In the [place] one drink equals one 12 oz bottle of beer (355 mL), one 5 oz glass of wine (148 mL), or one 1 oz glass of hard liquor (44 mL). General instructions  Schedule regular health, dental, and eye exams.  Stay current with your vaccines.  Tell your health care provider if: ? You often feel depressed. ? You have ever been abused or do not feel safe at home. Summary  Adopting a healthy lifestyle and getting preventive care are important in promoting health and wellness.  Follow your health care provider's instructions about healthy  diet, exercising, and getting tested or screened for diseases.  Follow your health care provider's instructions on monitoring your cholesterol and blood pressure. This information is not intended to replace advice given to you by your health care provider. Make sure you discuss any questions you have with your health care provider. Document Revised: 04/15/2018 Document Reviewed: 04/15/2018 Elsevier Patient Education  2020 Elsevier Inc.  

## 2019-05-18 SURGERY — Surgical Case
Anesthesia: *Unknown

## 2019-08-19 ENCOUNTER — Ambulatory Visit: Payer: 59 | Attending: Internal Medicine

## 2019-08-19 DIAGNOSIS — Z23 Encounter for immunization: Secondary | ICD-10-CM

## 2019-08-19 NOTE — Progress Notes (Signed)
   Covid-19 Vaccination Clinic  Name:  Diamond Bell    MRN: [id number] DOB: [birth year]  08/19/2019  [name] was observed post Covid-19 immunization for 15 minutes without incident. She was provided with Vaccine Information Sheet and instruction to access the V-Safe system.   [name] was instructed to call 911 with any severe reactions post vaccine: Marland Kitchen Difficulty breathing  . Swelling of face and throat  . A fast heartbeat  . A bad rash all over body  . Dizziness and weakness   Immunizations Administered    Name Date Dose VIS Date Route   Pfizer COVID-19 Vaccine 08/19/2019  9:56 AM 0.3 mL 04/16/2019 Intramuscular   Manufacturer: [place]   Lot: [id number]   [place]: [id number]

## 2019-09-13 ENCOUNTER — Ambulatory Visit: Payer: 59 | Attending: Internal Medicine

## 2019-09-13 DIAGNOSIS — Z23 Encounter for immunization: Secondary | ICD-10-CM

## 2019-09-13 NOTE — Progress Notes (Signed)
   Covid-19 Vaccination Clinic  Name:  Diamond Bell    MRN: [id number] DOB: April 13, 1991  09/13/2019  [name] was observed post Covid-19 immunization for 15 minutes without incident. She was provided with Vaccine Information Sheet and instruction to access the V-Safe system.   [name] was instructed to call 911 with any severe reactions post vaccine: Marland Kitchen Difficulty breathing  . Swelling of face and throat  . A fast heartbeat  . A bad rash all over body  . Dizziness and weakness   Immunizations Administered    Name Date Dose VIS Date Route   Pfizer COVID-19 Vaccine 09/13/2019  9:01 AM 0.3 mL 06/30/2018 Intramuscular   Manufacturer: [place]   Lot: [id number]   [place]: [id number]

## 2019-12-01 ENCOUNTER — Ambulatory Visit (INDEPENDENT_AMBULATORY_CARE_PROVIDER_SITE_OTHER): Payer: 59 | Admitting: Obstetrics and Gynecology

## 2019-12-01 ENCOUNTER — Encounter: Payer: Self-pay | Admitting: Obstetrics and Gynecology

## 2019-12-01 ENCOUNTER — Other Ambulatory Visit: Payer: Self-pay

## 2019-12-01 DIAGNOSIS — Z3009 Encounter for other general counseling and advice on contraception: Secondary | ICD-10-CM

## 2019-12-01 NOTE — Progress Notes (Signed)
Diamond Bell presents for discussion on contraception options. No sexual active. Engaged to be married in Sept Cycles, monthly, regular, last 4-5 days LMP current  H/O Exp lap for removal of dermoid H/O Asthma  Nulligravid  No STD's  Denies social habits  PE AF VSS Lungs clear Heart RRR Abd soft + BS   A/P Contraception management  Options reviewed with pt. U/R/B of each reviewed. Information provided to pt. Pt undecided at present but considering patch or IUD. Pt will call back with choice. More than 50% of time spent face to face

## 2019-12-01 NOTE — Patient Instructions (Signed)
Contraception Choices Contraception, also called birth control, refers to methods or devices that prevent pregnancy. Hormonal methods Contraceptive implant  A contraceptive implant is a thin, plastic tube that contains a hormone. It is inserted into the upper part of the arm. It can remain in place for up to 3 years. Progestin-only injections Progestin-only injections are injections of progestin, a synthetic form of the hormone progesterone. They are given every 3 months by a health care provider. Birth control pills  Birth control pills are pills that contain hormones that prevent pregnancy. They must be taken once a day, preferably at the same time each day. Birth control patch  The birth control patch contains hormones that prevent pregnancy. It is placed on the skin and must be changed once a week for three weeks and removed on the fourth week. A prescription is needed to use this method of contraception. Vaginal ring  A vaginal ring contains hormones that prevent pregnancy. It is placed in the vagina for three weeks and removed on the fourth week. After that, the process is repeated with a new ring. A prescription is needed to use this method of contraception. Emergency contraceptive Emergency contraceptives prevent pregnancy after unprotected sex. They come in pill form and can be taken up to 5 days after sex. They work best the sooner they are taken after having sex. Most emergency contraceptives are available without a prescription. This method should not be used as your only form of birth control. Barrier methods Female condom  A female condom is a thin sheath that is worn over the penis during sex. Condoms keep sperm from going inside a woman's body. They can be used with a spermicide to increase their effectiveness. They should be disposed after a single use. Female condom  A female condom is a soft, loose-fitting sheath that is put into the vagina before sex. The condom keeps sperm  from going inside a woman's body. They should be disposed after a single use. Diaphragm  A diaphragm is a soft, dome-shaped barrier. It is inserted into the vagina before sex, along with a spermicide. The diaphragm blocks sperm from entering the uterus, and the spermicide kills sperm. A diaphragm should be left in the vagina for 6-8 hours after sex and removed within 24 hours. A diaphragm is prescribed and fitted by a health care provider. A diaphragm should be replaced every 1-2 years, after giving birth, after gaining more than 15 lb (6.8 kg), and after pelvic surgery. Cervical cap  A cervical cap is a round, soft latex or plastic cup that fits over the cervix. It is inserted into the vagina before sex, along with spermicide. It blocks sperm from entering the uterus. The cap should be left in place for 6-8 hours after sex and removed within 48 hours. A cervical cap must be prescribed and fitted by a health care provider. It should be replaced every 2 years. Sponge  A sponge is a soft, circular piece of polyurethane foam with spermicide on it. The sponge helps block sperm from entering the uterus, and the spermicide kills sperm. To use it, you make it wet and then insert it into the vagina. It should be inserted before sex, left in for at least 6 hours after sex, and removed and thrown away within 30 hours. Spermicides Spermicides are chemicals that kill or block sperm from entering the cervix and uterus. They can come as a cream, jelly, suppository, foam, or tablet. A spermicide should be inserted into the   vagina with an applicator at least 10-15 minutes before sex to allow time for it to work. The process must be repeated every time you have sex. Spermicides do not require a prescription. Intrauterine contraception Intrauterine device (IUD) An IUD is a T-shaped device that is put in a woman's uterus. There are two types:  Hormone IUD.This type contains progestin, a synthetic form of the hormone  progesterone. This type can stay in place for 3-5 years.  Copper IUD.This type is wrapped in copper wire. It can stay in place for 10 years.  Permanent methods of contraception Female tubal ligation In this method, a woman's fallopian tubes are sealed, tied, or blocked during surgery to prevent eggs from traveling to the uterus. Hysteroscopic sterilization In this method, a small, flexible insert is placed into each fallopian tube. The inserts cause scar tissue to form in the fallopian tubes and block them, so sperm cannot reach an egg. The procedure takes about 3 months to be effective. Another form of birth control must be used during those 3 months. Female sterilization This is a procedure to tie off the tubes that carry sperm (vasectomy). After the procedure, the man can still ejaculate fluid (semen). Natural planning methods Natural family planning In this method, a couple does not have sex on days when the woman could become pregnant. Calendar method This means keeping track of the length of each menstrual cycle, identifying the days when pregnancy can happen, and not having sex on those days. Ovulation method In this method, a couple avoids sex during ovulation. Symptothermal method This method involves not having sex during ovulation. The woman typically checks for ovulation by watching changes in her temperature and in the consistency of cervical mucus. Post-ovulation method In this method, a couple waits to have sex until after ovulation. Summary  Contraception, also called birth control, means methods or devices that prevent pregnancy.  Hormonal methods of contraception include implants, injections, pills, patches, vaginal rings, and emergency contraceptives.  Barrier methods of contraception can include female condoms, female condoms, diaphragms, cervical caps, sponges, and spermicides.  There are two types of IUDs (intrauterine devices). An IUD can be put in a woman's uterus to  prevent pregnancy for 3-5 years.  Permanent sterilization can be done through a procedure for males, females, or both.  Natural family planning methods involve not having sex on days when the woman could become pregnant. This information is not intended to replace advice given to you by your health care provider. Make sure you discuss any questions you have with your health care provider. Document Revised: 04/24/2017 Document Reviewed: 05/25/2016 Elsevier Patient Education  2020 [employer]  

## 2019-12-06 ENCOUNTER — Telehealth: Payer: Self-pay | Admitting: *Deleted

## 2019-12-06 NOTE — Telephone Encounter (Signed)
Pt called and spoke with Kara Dies. She requested a call back in order to discuss types of IUD available. I returned call to pt and left message stating that I will send her a MyChart message.

## 2019-12-30 ENCOUNTER — Encounter: Payer: Self-pay | Admitting: Obstetrics and Gynecology

## 2019-12-30 ENCOUNTER — Other Ambulatory Visit: Payer: Self-pay

## 2019-12-30 ENCOUNTER — Ambulatory Visit (INDEPENDENT_AMBULATORY_CARE_PROVIDER_SITE_OTHER): Payer: 59 | Admitting: Obstetrics and Gynecology

## 2019-12-30 VITALS — BP 142/98 | HR 79 | Wt 136.5 lb

## 2019-12-30 DIAGNOSIS — Z3202 Encounter for pregnancy test, result negative: Secondary | ICD-10-CM

## 2019-12-30 DIAGNOSIS — Z3009 Encounter for other general counseling and advice on contraception: Secondary | ICD-10-CM

## 2019-12-30 LAB — POCT PREGNANCY, URINE: Preg Test, Ur: NEGATIVE

## 2019-12-30 NOTE — Patient Instructions (Signed)
IUD PLACEMENT POST-PROCEDURE INSTRUCTIONS  1. You may take Ibuprofen, Aleve or Tylenol for pain if needed.  Cramping should resolve within in 24 hours.  2. You may have a small amount of spotting.  You should wear a mini pad for the next few days.  3. You may have intercourse after 24 hours.  If you using this for birth control, it is effective immediately.  4. You need to call if you have any pelvic pain, fever, heavy bleeding or foul smelling vaginal discharge.  Irregular bleeding is common the first several months after having an IUD placed. You do not need to call for this reason unless you are concerned.  5. Shower or bathe as normal  You should have a follow-up appointment in 4-8 weeks for a re-check to make sure you are not having any problems.Intrauterine Device Insertion An intrauterine device (IUD) is a medical device that gets inserted into the uterus to prevent pregnancy. It is a small, T-shaped device that has one or two nylon strings hanging down from it. The strings hang out of the lower part of the uterus (cervix) to allow for future IUD removal. There are two types of IUDs available:  Copper IUD. This type of IUD has copper wire wrapped around it. Copper makes the uterus and fallopian tubes produce a fluid that kills sperm. A copper IUD may last up to 10 years.  Hormone IUD. This type of IUD is made of plastic and contains the hormone progestin (synthetic progesterone). The hormone thickens mucus in the cervix and prevents sperm from entering the uterus. It also thins the uterine lining to prevent implantation of a fertilized egg. The hormone can weaken or kill the sperm that get into the uterus. A hormone IUD may last 3-5 years. Tell a health care provider about:  Any allergies you have.  All medicines you are taking, including vitamins, herbs, eye drops, creams, and over-the-counter medicines.  Any problems you or family members have had with anesthetic medicines.  Any  blood disorders you have.  Any surgeries you have had.  Any medical conditions you have, including any STIs (sexually transmitted infections) you may have.  Whether you are pregnant or may be pregnant. What are the risks? Generally, this is a safe procedure. However, problems may occur, including:  Infection.  Bleeding.  Allergic reactions to medicines.  Accidental puncture (perforation) of the uterus, or damage to other structures or organs.  Accidental placement of the IUD either in the muscle layer of the uterus (myometrium) or outside the uterus.  The IUD falling out of the uterus (expulsion). This is more common among women who have recently had a child.  Pregnancy that happens in the fallopian tube (ectopic pregnancy).  Infection of the uterus and fallopian tubes (pelvic inflammatory disease). What happens before the procedure?  Schedule the IUD insertion for when you will have your menstrual period or right after, to make sure you are not pregnant. Placement of the IUD is better tolerated shortly after a menstrual cycle.  Follow instructions from your health care provider about eating or drinking restrictions.  Ask your health care provider about changing or stopping your regular medicines. This is especially important if you are taking diabetes medicines or blood thinners.  You may get a pain reliever to take before the procedure.  You may have tests for: ? Pregnancy. A pregnancy test involves having a urine sample taken. ? STIs. Placing an IUD in someone who has an STI can make  the infection worse. ? Cervical cancer. You may have a Pap test to check for this type of cancer. This means collecting cells from your cervix to be examined under a microscope.  You may have a physical exam to determine the size and position of your uterus. The procedure may vary among health care providers and hospitals. What happens during the procedure?  A tool (speculum) will be placed  in your vagina and widened so that your health care provider can see your cervix.  Medicine may be applied to your cervix to help lower your risk of infection (antiseptic medicine).  You may be given an anesthetic medicine to numb each side of your cervix (intracervical block or paracervical block). This medicine is usually given by an injection into the cervix.  A tool (uterine sound) will be inserted into your uterus to determine the length of your uterus and the direction that your uterus may be tilted.  A slim instrument or tube (IUD inserter) that holds the IUD will be inserted into your vagina, through your cervical canal, and into your uterus.  The IUD will be placed in the uterus, and the IUD inserter will be removed.  The strings that are attached to the IUD will be trimmed so that they lie just below the cervix. The procedure may vary among health care providers and hospitals. What happens after the procedure?  You may have bleeding after the procedure. This is normal. It varies from light bleeding (spotting) for a few days to menstrual-like bleeding.  You may have cramping and pain.  You may feel dizzy or light-headed.  You may have lower back pain. Summary  An intrauterine device (IUD) is a small, T-shaped device that has one or two nylon strings hanging down from it.  Two types of IUDs are available. You may have a copper IUD or a hormone IUD.  Schedule the IUD insertion for when you will have your menstrual period or right after, to make sure you are not pregnant. Placement of the IUD is better tolerated shortly after a menstrual cycle.  You may have bleeding after the procedure. This is normal. It varies from light spotting for a few days to menstrual-like bleeding. This information is not intended to replace advice given to you by your health care provider. Make sure you discuss any questions you have with your health care provider. Document Revised: 04/04/2017  Document Reviewed: 03/13/2016 Elsevier Patient Education  2020 [employer].

## 2019-12-30 NOTE — Progress Notes (Signed)
Pt presented for Skyla insertion but we need to order Order has been placed Will have pt return next week for insertion

## 2020-01-05 ENCOUNTER — Encounter: Payer: Self-pay | Admitting: Obstetrics and Gynecology

## 2020-01-05 ENCOUNTER — Other Ambulatory Visit: Payer: Self-pay

## 2020-01-05 ENCOUNTER — Ambulatory Visit (INDEPENDENT_AMBULATORY_CARE_PROVIDER_SITE_OTHER): Payer: 59 | Admitting: Obstetrics and Gynecology

## 2020-01-05 ENCOUNTER — Telehealth: Payer: Self-pay | Admitting: Obstetrics and Gynecology

## 2020-01-05 DIAGNOSIS — Z30430 Encounter for insertion of intrauterine contraceptive device: Secondary | ICD-10-CM | POA: Insufficient documentation

## 2020-01-05 DIAGNOSIS — Z3202 Encounter for pregnancy test, result negative: Secondary | ICD-10-CM | POA: Diagnosis not present

## 2020-01-05 LAB — POCT PREGNANCY, URINE: Preg Test, Ur: NEGATIVE

## 2020-01-05 MED ORDER — LEVONORGESTREL 13.5 MG IU IUD
1.0000 | INTRAUTERINE_SYSTEM | Freq: Once | INTRAUTERINE | Status: AC
  Administered 2020-01-05: 13.5 mg via INTRAUTERINE

## 2020-01-05 NOTE — Addendum Note (Signed)
Addended by: Louisa Second E on: 01/05/2020 05:10 PM   Modules accepted: Orders

## 2020-01-05 NOTE — Patient Instructions (Signed)

## 2020-01-05 NOTE — Telephone Encounter (Signed)
Patient stated that she will call the office back to schedule her next follow-up appointment the day of check-out ( 01/05/20).

## 2020-01-05 NOTE — Progress Notes (Signed)
[place] PROCEDURE NOTE  Diamond Bell is a 29 y.o.  here for [facility] IUD insertion. No GYN concerns.   IUD Removal and Reinsertion  Patient identified, informed consent performed, consent signed.   Discussed risks of irregular bleeding, cramping, infection, malpositioning or misplacement of the IUD outside the uterus which may require further procedures. Also advised to use backup contraception for one week as the risk of pregnancy is higher during the transition period of removing an IUD and replacing it with another one. Time out was performed. Speculum placed in the vagina. The strings of the IUD were grasped and pulled using ring forceps. The IUD was successfully removed in its entirety. The cervix was cleaned with Betadine x 2 and grasped anteriorly with a single tooth tenaculum.  The new Skyla IUD insertion apparatus was used to sound the uterus to 7 cm;  the IUD was then placed per manufacturer's recommendations. Strings trimmed to 3 cm. Tenaculum was removed, good hemostasis noted. Patient tolerated procedure well.   Patient was given post-procedure instructions.  She was reminded to have backup contraception for one week during this transition period between IUDs.  Patient was also asked to check IUD strings periodically and follow up in 4 weeks for IUD check.  Arlina Robes, MD Attending [employer] for [facility], [place]

## 2020-01-13 ENCOUNTER — Encounter: Payer: Self-pay | Admitting: General Practice

## 2021-02-21 IMAGING — CT CT ABD-PELV W/ CM
2 of 4 series · 16 of 46 positions shown, 18 images · IV contrast (OMNIPAQUE 300)
Comparison: None.

CLINICAL DATA: Right lower quadrant abdominal pain.

EXAM:
CT ABDOMEN AND PELVIS WITH CONTRAST
TECHNIQUE: Multidetector CT imaging of the abdomen and pelvis was performed
using the standard protocol following bolus administration of
intravenous contrast.
CONTRAST:  100mL OMNIPAQUE IOHEXOL 300 MG/ML  SOLN

[Series 2: axial st · axial · 0.67mm/px · z∈[+1090,+1480]mm · 13 of 88 slices shown, 15 images]
[im 5/88  soft-tissue]
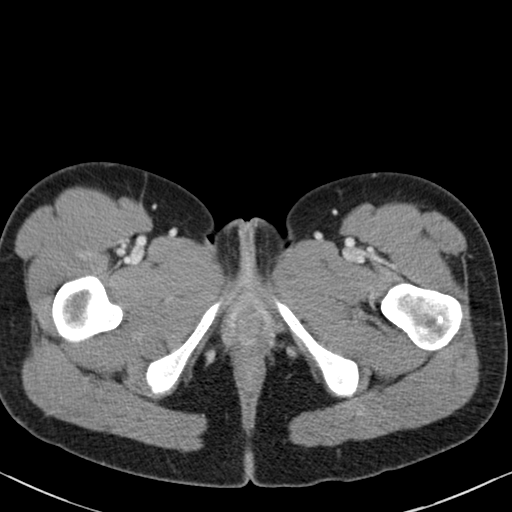
[im 5/88  bone]
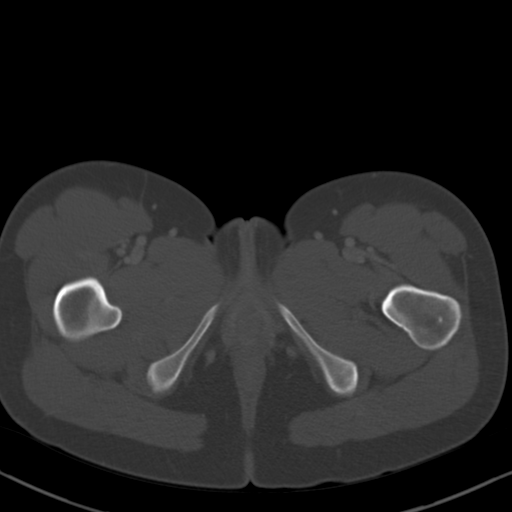
[im 13/88  soft-tissue]
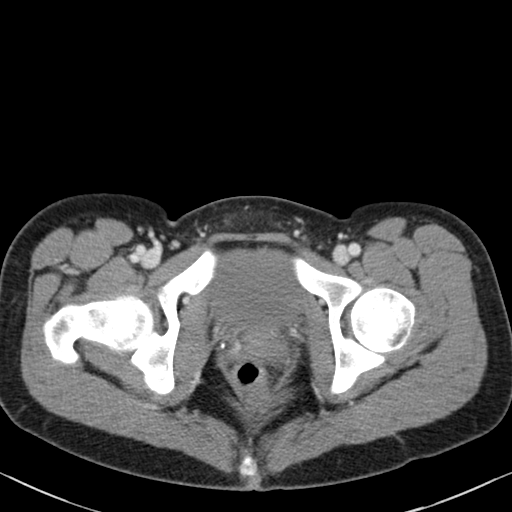
[im 17/88  soft-tissue]
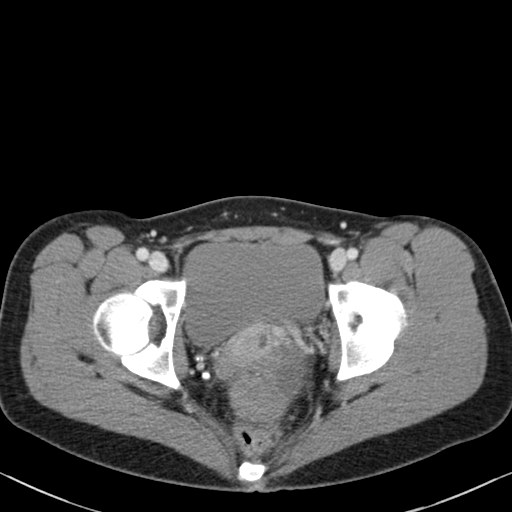
[im 25/88  soft-tissue]
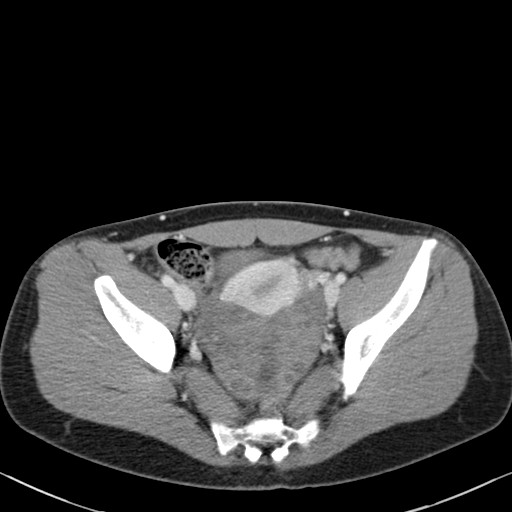
[im 30/88  soft-tissue]
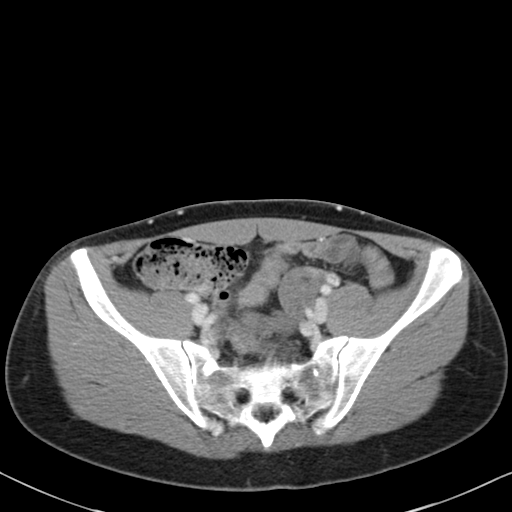
[im 38/88  soft-tissue]
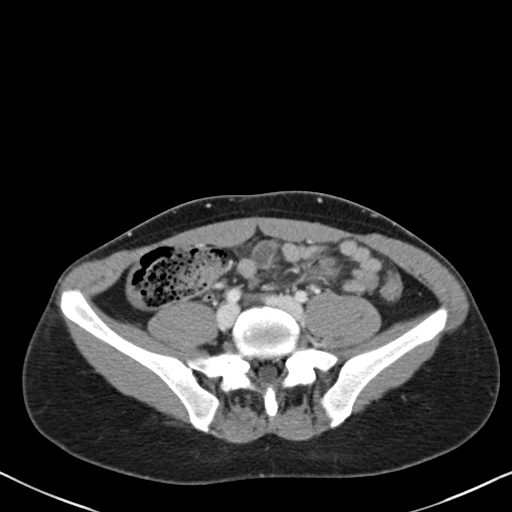
[im 46/88  soft-tissue]
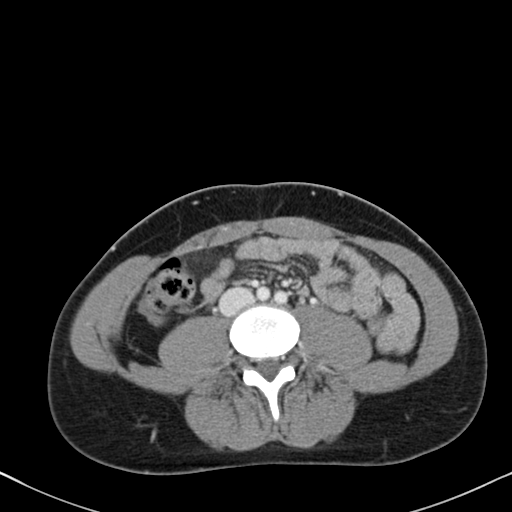
[im 50/88  soft-tissue]
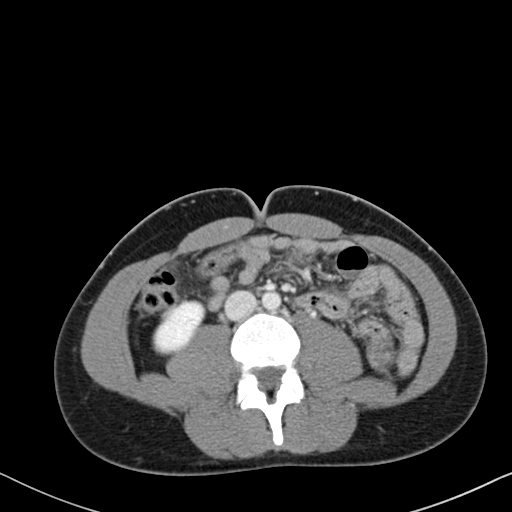
[im 59/88  soft-tissue]
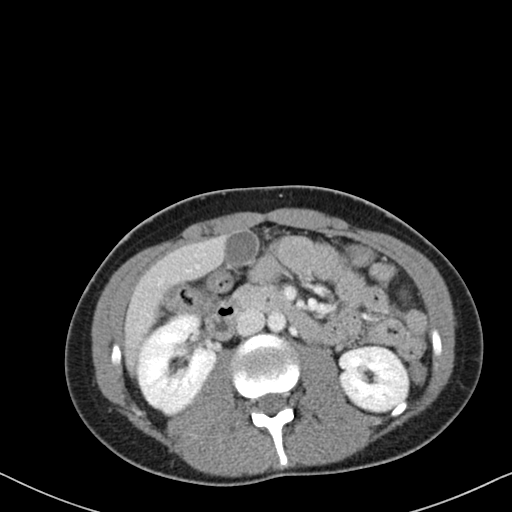
[im 59/88  bone]
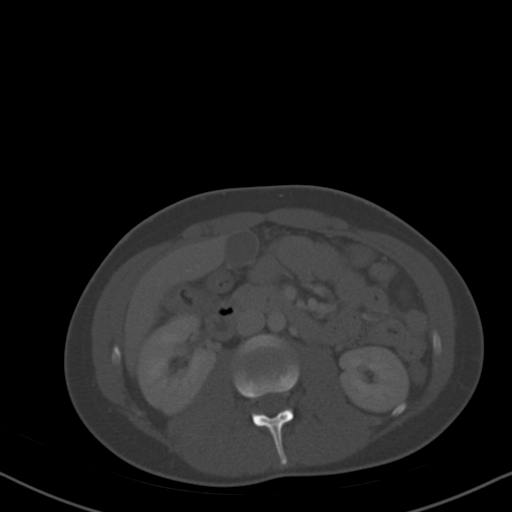
[im 63/88  soft-tissue]
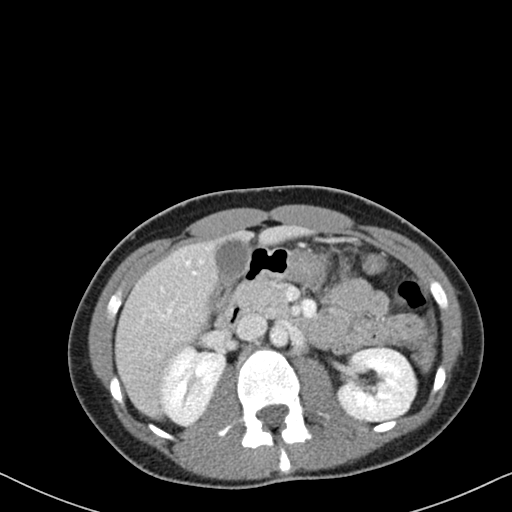
[im 71/88  soft-tissue]
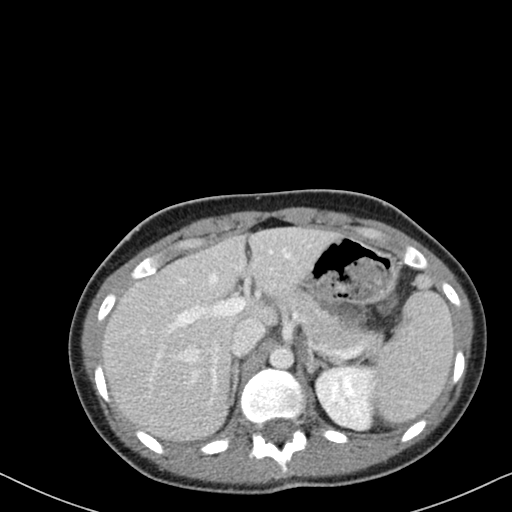
[im 75/88  soft-tissue]
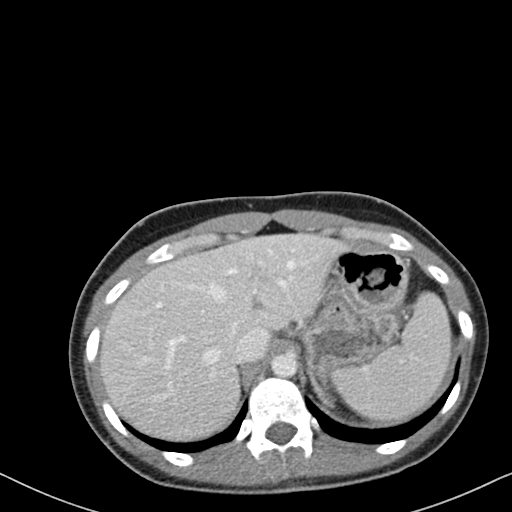
[im 83/88  soft-tissue]
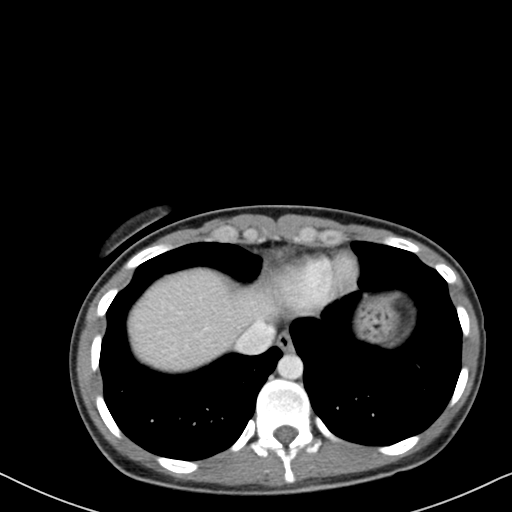

[Series 5: coronal st · coronal · 0.67mm/px · 3 of 101 slices shown]
[im 34/101  soft-tissue]
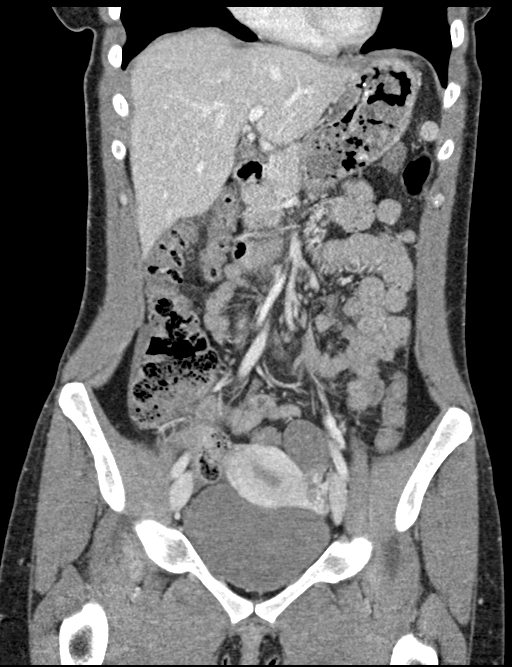
[im 45/101  soft-tissue]
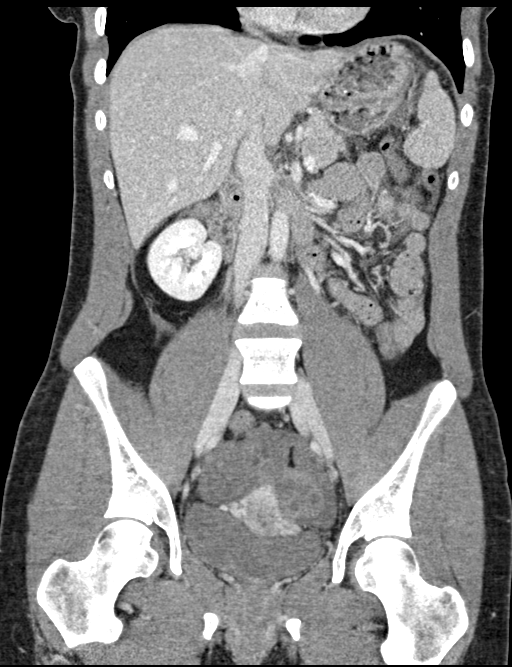
[im 56/101  soft-tissue]
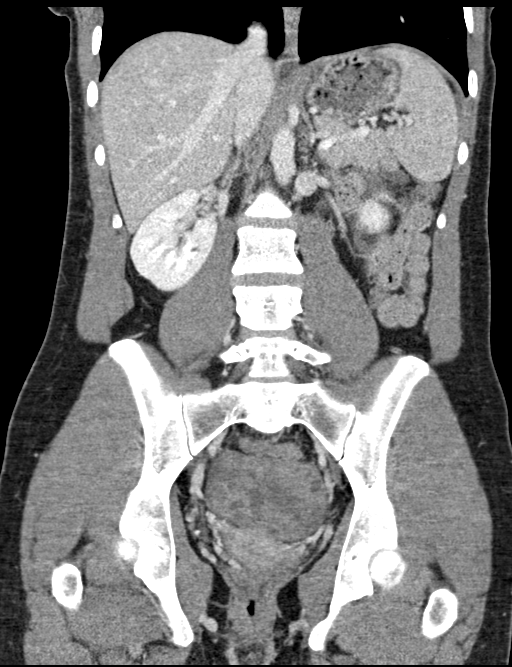

[16 of 46 positions shown; findings below may reference images not displayed]

FINDINGS: Lower chest: Lung bases are clear.

Hepatobiliary: No focal liver abnormality is seen. No gallstones,
gallbladder wall thickening, or biliary dilatation.

Pancreas: No ductal dilatation or inflammation.

Spleen: Normal in size without focal abnormality. Splenule
anteriorly.

Adrenals/Urinary Tract: Normal adrenal glands. No hydronephrosis or
perinephric edema. Homogeneous renal enhancement. Urinary bladder is
physiologically distended without wall thickening.

Stomach/Bowel: Normal appendix, for example series 2, image 57.
Moderate stool in the right colon, remainder of the colon is
decompressed. No bowel obstruction or inflammation. The stomach is
unremarkable.

Vascular/Lymphatic: Abdominal aorta is normal in caliber. The portal
vein is patent. Slight prominence of left ovarian veins. No
adenopathy.

Reproductive: Large heterogeneous pelvic mass which involves the
midline, as well as both adnexa, slightly more prominent on the
left. This measures 11.5 x 9.4 x 8.7 cm. Is heterogeneous
enhancement in a fat containing component, findings consistent with
dermoid. Is uncertain which ovary this arises from, however does
appear to extend more into the left adnexa. Uterus is displaced
anteriorly. Probable nabothian cyst. Slight prominent left adnexal
vascularity.

Other: No free fluid. No free air.

Musculoskeletal: There are no acute or suspicious osseous
abnormalities.
IMPRESSION: 1. Large heterogeneous pelvic mass measuring 11.5 x 9.4 x 8.7 cm
with fatty components consistent with dermoid. It is uncertain which
ovary this arises from, however does appear to extend more into the
left adnexa. While pelvic ultrasound may provide more detailed
assessment, given size, gynecologic consultation is recommended.
2. Normal appendix.
